# Patient Record
Sex: Female | Born: 1995 | Hispanic: Yes | Marital: Single | Smoking: Never smoker
Health system: Southern US, Community
[De-identification: ages and names within clinical notes are randomized; demographics above are authoritative.]

## PROBLEM LIST (undated history)

## (undated) DIAGNOSIS — D649 Anemia, unspecified: Secondary | ICD-10-CM

## (undated) DIAGNOSIS — E669 Obesity, unspecified: Secondary | ICD-10-CM

## (undated) HISTORY — PX: NO PAST SURGERIES: SHX2092

## (undated) HISTORY — PX: OTHER SURGICAL HISTORY: SHX169

---

## 2017-06-20 NOTE — L&D Delivery Note (Signed)
Delivery Note At 12:03 AM a viable female was delivered via Vaginal, Spontaneous (Presentation:ROA  ).  APGAR:8, 9; weight pending  .   Placenta status: spontaneous, intact.  Cord: 3VC, loose nuchal reduced on perineum without complications.  Cord pH: N/A  Anesthesia: Epidural Episiotomy: None Lacerations: None Suture Repair: none Est. Blood Loss (mL):   Mom to postpartum.  Baby to Couplet care / Skin to Skin.  Vena Austria 02/23/2018, 12:19 AM

## 2017-07-04 ENCOUNTER — Encounter: Payer: Self-pay | Admitting: Maternal Newborn

## 2017-07-04 ENCOUNTER — Ambulatory Visit (INDEPENDENT_AMBULATORY_CARE_PROVIDER_SITE_OTHER): Payer: Medicaid Other | Admitting: Maternal Newborn

## 2017-07-04 VITALS — BP 110/70 | Ht 61.0 in | Wt 179.0 lb

## 2017-07-04 DIAGNOSIS — Z0189 Encounter for other specified special examinations: Secondary | ICD-10-CM

## 2017-07-04 DIAGNOSIS — Z3A01 Less than 8 weeks gestation of pregnancy: Secondary | ICD-10-CM | POA: Diagnosis not present

## 2017-07-04 DIAGNOSIS — Z3481 Encounter for supervision of other normal pregnancy, first trimester: Secondary | ICD-10-CM | POA: Diagnosis not present

## 2017-07-04 DIAGNOSIS — Z6833 Body mass index (BMI) 33.0-33.9, adult: Secondary | ICD-10-CM | POA: Insufficient documentation

## 2017-07-04 DIAGNOSIS — Z3490 Encounter for supervision of normal pregnancy, unspecified, unspecified trimester: Secondary | ICD-10-CM | POA: Insufficient documentation

## 2017-07-04 NOTE — Progress Notes (Signed)
07/04/2017   Chief Complaint: Amenorrhea, positive home pregnancy test, desires prenatal care.  Transfer of Care Patient: no  History of Present Illness: Judith Beard is a 22 y.o. G2P1001 at 5w 4d based on Patient's last menstrual period on12/12/2016 (exact date), with an Estimated Date of Delivery: 03/02/2018, with the above CC.   Her periods were: regular periods every 28 days, lasting 4-5 days. She was using no method when she conceived.  She has Positive signs or symptoms of nausea of pregnancy. Currently well controlled and does not desire medication. She has Negative signs or symptoms of miscarriage or preterm labor She identifies Negative Zika risk factors for her and her partner On any different medications around the time she conceived/early pregnancy: No  History of varicella: Yes   Pregnancy confirmed at [facility] on 06/29/2017.  ROS: A 12-point review of systems was performed and negative, except as stated in the above HPI.  OBGYN History: As per HPI. OB History  Gravida Para Term Preterm AB Living  2 1 1     1   SAB TAB Ectopic Multiple Live Births          1    # Outcome Date GA Lbr Len/2nd Weight Sex Delivery Anes PTL Lv  2 Current           1 Term 04/15/14 4745w0d  7 lb 10 oz (3.459 kg) M Vag-Spont   LIV     Birth Comments: IOL      Any issues with any prior pregnancies: no, G1 elective induction at term  Any prior children are healthy, doing well, without any problems or issues: yes History of pap smears: None, obtained today History of STIs: No   Past Medical History: History reviewed. No pertinent past medical history.  Past Surgical History: History reviewed. No pertinent surgical history.  Family History:  Family History  Problem Relation Age of Onset  . Hypertension Maternal Grandfather    She denies any female cancers, bleeding or blood clotting disorders.  She denies any history of intellectual disability, birth defects or genetic disorders in her  or the FOB's history  Social History:  Social History   Socioeconomic History  . Marital status: Single    Spouse name: Not on file  . Number of children: 1  . Years of education: 2812  . Highest education level: Not on file  Social Needs  . Financial resource strain: Not on file  . Food insecurity - worry: Not on file  . Food insecurity - inability: Not on file  . Transportation needs - medical: Not on file  . Transportation needs - non-medical: Not on file  Occupational History  . Occupation: Conservation officer, naturecashier    Comment[place] at a [employer]  Tobacco Use  . Smoking status: Never Smoker  . Smokeless tobacco: Never Used  Substance and Sexual Activity  . Alcohol use: No    Frequency: Never  . Drug use: No  . Sexual activity: Yes    Birth control/protection: None  Other Topics Concern  . Not on file  Social History Narrative  . Not on file   Any cats in the household: no Denies history of and current domestic violence.  Allergy: Allergies  Allergen Reactions  . Penicillins Shortness Of Breath and Swelling  . Latex Swelling    Current Outpatient Medications: No current outpatient medications on file. Taking OTC prenatal vitamins.  Physical Exam:   BP 110/70   Ht 5\' 1"  (1.549 m)   Wt 179  lb (81.2 kg)   LMP 05/26/2017 (Exact Date)   BMI 33.82 kg/m  Body mass index is 33.82 kg/m. Constitutional: Well nourished, well developed female in no acute distress.  Neck:  Supple, normal appearance, and no thyromegaly  Cardiovascular: S1, S2 normal, no murmur, rub or gallop, regular rate and rhythm Respiratory:  Clear to auscultation bilaterally. Normal respiratory effort Abdomen: no masses, hernias; diffusely non tender to palpation, non distended Breasts: breasts appear normal, no suspicious masses, no skin or nipple changes or axillary nodes. Neuro/Psych:  Normal mood and affect.  Skin:  Warm and dry.  Lymphatic:  No inguinal lymphadenopathy.   Pelvic exam: is not limited by  body habitus External genitalia, Bartholin's glands, Urethra, Skene's glands: within normal limits Vagina: within normal limits and with no blood in the vault  Cervix: normal appearing cervix without discharge or lesions, closed/long/high Uterus:  enlarged, c/w early pregnancy Adnexa:  no mass, fullness, tenderness  Assessment: Judith Beard is a 22 y.o. G2P1001 at 5w 4d based on Patient's last menstrual period on 05/26/2017 (exact date), with an Estimated Date of Delivery: 03/02/2018, presenting for prenatal care.  Plan:  1) Avoid alcoholic beverages. 2) Patient encouraged not to smoke.  3) Discontinue the use of all non-medicinal drugs and chemicals.  4) Take prenatal vitamins daily. Samples given. 5) Seatbelt use advised. 6) Nutrition, food safety (fish, cheese advisories, and high nitrite foods) and exercise discussed. 7) Hospital and practice style delivering at [facility] discussed. 8) Patient is asked about travel to areas at risk for the Zika virus, and counseled to avoid travel and exposure to mosquitoes or sexual partners who may have themselves been exposed to the virus. Testing is discussed, and will be ordered as appropriate.  9) Childbirth classes at [facility] advised. 10) Genetic Screening, such as with 1st Trimester Screening, cell free fetal DNA, AFP testing, and Ultrasound, as well as with amniocentesis and CVS as appropriate, is discussed with patient. She plans to have genetic testing this pregnancy. 11) Early GTT indicated for BMI of 33. Obtain next visit with NOB labs. 12) Ultrasound ordered for next visit for dating/viability.  Problem list reviewed and updated.  Return in about 1 week (around 07/11/2017) for ROB with GTT/labs and ultrasound.  Marcelyn Bruins, CNM [facility], [facility] 07/04/2017  11:46 AM

## 2017-07-04 NOTE — Patient Instructions (Signed)
First Trimester of Pregnancy The first trimester of pregnancy is from week 1 until the end of week 13 (months 1 through 3). A week after a sperm fertilizes an egg, the egg will implant on the wall of the uterus. This embryo will begin to develop into a baby. Genes from you and your partner will form the baby. The female genes will determine whether the baby will be a boy or a girl. At 6-8 weeks, the eyes and face will be formed, and the heartbeat can be seen on ultrasound. At the end of 12 weeks, all the baby's organs will be formed. Now that you are pregnant, you will want to do everything you can to have a healthy baby. Two of the most important things are to get good prenatal care and to follow your health care provider's instructions. Prenatal care is all the medical care you receive before the baby's birth. This care will help prevent, find, and treat any problems during the pregnancy and childbirth. Body changes during your first trimester Your body goes through many changes during pregnancy. The changes vary from woman to woman.  You may gain or lose a couple of pounds at first.  You may feel sick to your stomach (nauseous) and you may throw up (vomit). If the vomiting is uncontrollable, call your health care provider.  You may tire easily.  You may develop headaches that can be relieved by medicines. All medicines should be approved by your health care provider.  You may urinate more often. Painful urination may mean you have a bladder infection.  You may develop heartburn as a result of your pregnancy.  You may develop constipation because certain hormones are causing the muscles that push stool through your intestines to slow down.  You may develop hemorrhoids or swollen veins (varicose veins).  Your breasts may begin to grow larger and become tender. Your nipples may stick out more, and the tissue that surrounds them (areola) may become darker.  Your gums may bleed and may be  sensitive to brushing and flossing.  Dark spots or blotches (chloasma, mask of pregnancy) may develop on your face. This will likely fade after the baby is born.  Your menstrual periods will stop.  You may have a loss of appetite.  You may develop cravings for certain kinds of food.  You may have changes in your emotions from day to day, such as being excited to be pregnant or being concerned that something may go wrong with the pregnancy and baby.  You may have more vivid and strange dreams.  You may have changes in your hair. These can include thickening of your hair, rapid growth, and changes in texture. Some women also have hair loss during or after pregnancy, or hair that feels dry or thin. Your hair will most likely return to normal after your baby is born.  What to expect at prenatal visits During a routine prenatal visit:  You will be weighed to make sure you and the baby are growing normally.  Your blood pressure will be taken.  Your abdomen will be measured to track your baby's growth.  The fetal heartbeat will be listened to between weeks 10 and 14 of your pregnancy.  Test results from any previous visits will be discussed.  Your health care provider may ask you:  How you are feeling.  If you are feeling the baby move.  If you have had any abnormal symptoms, such as leaking fluid, bleeding, severe headaches,   or abdominal cramping.  If you are using any tobacco products, including cigarettes, chewing tobacco, and electronic cigarettes.  If you have any questions.  Other tests that may be performed during your first trimester include:  Blood tests to find your blood type and to check for the presence of any previous infections. The tests will also be used to check for low iron levels (anemia) and protein on red blood cells (Rh antibodies). Depending on your risk factors, or if you previously had diabetes during pregnancy, you may have tests to check for high blood  sugar that affects pregnant women (gestational diabetes).  Urine tests to check for infections, diabetes, or protein in the urine.  An ultrasound to confirm the proper growth and development of the baby.  Fetal screens for spinal cord problems (spina bifida) and Down syndrome.  HIV (human immunodeficiency virus) testing. Routine prenatal testing includes screening for HIV, unless you choose not to have this test.  You may need other tests to make sure you and the baby are doing well.  Follow these instructions at home: Medicines  Follow your health care provider's instructions regarding medicine use. Specific medicines may be either safe or unsafe to take during pregnancy.  Take a prenatal vitamin that contains at least 600 micrograms (mcg) of folic acid.  If you develop constipation, try taking a stool softener if your health care provider approves. Eating and drinking  Eat a balanced diet that includes fresh fruits and vegetables, whole grains, good sources of protein such as meat, eggs, or tofu, and low-fat dairy. Your health care provider will help you determine the amount of weight gain that is right for you.  Avoid raw meat and uncooked cheese. These carry germs that can cause birth defects in the baby.  Eating four or five small meals rather than three large meals a day may help relieve nausea and vomiting. If you start to feel nauseous, eating a few soda crackers can be helpful. Drinking liquids between meals, instead of during meals, also seems to help ease nausea and vomiting.  Limit foods that are high in fat and processed sugars, such as fried and sweet foods.  To prevent constipation: ? Eat foods that are high in fiber, such as fresh fruits and vegetables, whole grains, and beans. ? Drink enough fluid to keep your urine clear or pale yellow. Activity  Exercise only as directed by your health care provider. Most women can continue their usual exercise routine during  pregnancy. Try to exercise for 30 minutes at least 5 days a week. Exercising will help you: ? Control your weight. ? Stay in shape. ? Be prepared for labor and delivery.  Experiencing pain or cramping in the lower abdomen or lower back is a good sign that you should stop exercising. Check with your health care provider before continuing with normal exercises.  Try to avoid standing for long periods of time. Move your legs often if you must stand in one place for a long time.  Avoid heavy lifting.  Wear low-heeled shoes and practice good posture.  You may continue to have sex unless your health care provider tells you not to. Relieving pain and discomfort  Wear a good support bra to relieve breast tenderness.  Take warm sitz baths to soothe any pain or discomfort caused by hemorrhoids. Use hemorrhoid cream if your health care provider approves.  Rest with your legs elevated if you have leg cramps or low back pain.  If you develop   varicose veins in your legs, wear support hose. Elevate your feet for 15 minutes, 3-4 times a day. Limit salt in your diet. Prenatal care  Schedule your prenatal visits by the twelfth week of pregnancy. They are usually scheduled monthly at first, then more often in the last 2 months before delivery.  Write down your questions. Take them to your prenatal visits.  Keep all your prenatal visits as told by your health care provider. This is important. Safety  Wear your seat belt at all times when driving.  Make a list of emergency phone numbers, including numbers for family, friends, the hospital, and police and fire departments. General instructions  Ask your health care provider for a referral to a local prenatal education class. Begin classes no later than the beginning of month 6 of your pregnancy.  Ask for help if you have counseling or nutritional needs during pregnancy. Your health care provider can offer advice or refer you to specialists for help  with various needs.  Do not use hot tubs, steam rooms, or saunas.  Do not douche or use tampons or scented sanitary pads.  Do not cross your legs for long periods of time.  Avoid cat litter boxes and soil used by cats. These carry germs that can cause birth defects in the baby and possibly loss of the fetus by miscarriage or stillbirth.  Avoid all smoking, herbs, alcohol, and medicines not prescribed by your health care provider. Chemicals in these products affect the formation and growth of the baby.  Do not use any products that contain nicotine or tobacco, such as cigarettes and e-cigarettes. If you need help quitting, ask your health care provider. You may receive counseling support and other resources to help you quit.  Schedule a dentist appointment. At home, brush your teeth with a soft toothbrush and be gentle when you floss. Contact a health care provider if:  You have dizziness.  You have mild pelvic cramps, pelvic pressure, or nagging pain in the abdominal area.  You have persistent nausea, vomiting, or diarrhea.  You have a bad smelling vaginal discharge.  You have pain when you urinate.  You notice increased swelling in your face, hands, legs, or ankles.  You are exposed to fifth disease or chickenpox.  You are exposed to German measles (rubella) and have never had it. Get help right away if:  You have a fever.  You are leaking fluid from your vagina.  You have spotting or bleeding from your vagina.  You have severe abdominal cramping or pain.  You have rapid weight gain or loss.  You vomit blood or material that looks like coffee grounds.  You develop a severe headache.  You have shortness of breath.  You have any kind of trauma, such as from a fall or a car accident. Summary  The first trimester of pregnancy is from week 1 until the end of week 13 (months 1 through 3).  Your body goes through many changes during pregnancy. The changes vary from  woman to woman.  You will have routine prenatal visits. During those visits, your health care provider will examine you, discuss any test results you may have, and talk with you about how you are feeling. This information is not intended to replace advice given to you by your health care provider. Make sure you discuss any questions you have with your health care provider. Document Released: 05/31/2001 Document Revised: 05/18/2016 Document Reviewed: 05/18/2016 Elsevier Interactive Patient Education  2018 Elsevier   Inc.  

## 2017-07-04 NOTE — Progress Notes (Signed)
No [email or web address].rj 

## 2017-07-06 LAB — URINE DRUG PANEL 7
Amphetamines, Urine: NEGATIVE ng/mL
BENZODIAZEPINE QUANT UR: NEGATIVE ng/mL
Barbiturate Quant, Ur: NEGATIVE ng/mL
COCAINE (METAB.): NEGATIVE ng/mL
Cannabinoid Quant, Ur: NEGATIVE ng/mL
OPIATE QUANT UR: NEGATIVE ng/mL
PCP QUANT UR: NEGATIVE ng/mL

## 2017-07-06 LAB — PAP LB, CT-NG, RFX HPV ASCU
CHLAMYDIA, NUC. ACID AMP: NEGATIVE
Gonococcus, Nuc. Acid Amp: NEGATIVE
PAP Smear Comment: 0

## 2017-07-06 LAB — URINE CULTURE

## 2017-07-12 ENCOUNTER — Other Ambulatory Visit: Payer: Self-pay

## 2017-07-12 ENCOUNTER — Emergency Department
Admission: EM | Admit: 2017-07-12 | Discharge: 2017-07-12 | Disposition: A | Discharge status: Home / Self Care | Payer: Medicaid Other | Attending: Emergency Medicine | Admitting: Emergency Medicine

## 2017-07-12 DIAGNOSIS — Z3A00 Weeks of gestation of pregnancy not specified: Secondary | ICD-10-CM | POA: Diagnosis not present

## 2017-07-12 DIAGNOSIS — O200 Threatened abortion: Secondary | ICD-10-CM | POA: Insufficient documentation

## 2017-07-12 DIAGNOSIS — Z91040 Latex allergy status: Secondary | ICD-10-CM | POA: Diagnosis not present

## 2017-07-12 DIAGNOSIS — O209 Hemorrhage in early pregnancy, unspecified: Secondary | ICD-10-CM | POA: Diagnosis present

## 2017-07-12 LAB — CBC WITH DIFFERENTIAL/PLATELET
BASOS ABS: 0 10*3/uL (ref 0–0.1)
BASOS PCT: 0 %
EOS PCT: 2 %
Eosinophils Absolute: 0.2 10*3/uL (ref 0–0.7)
HCT: 37.9 % (ref 35.0–47.0)
Hemoglobin: 13.4 g/dL (ref 12.0–16.0)
LYMPHS PCT: 30 %
Lymphs Abs: 3.2 10*3/uL (ref 1.0–3.6)
MCH: 34.2 pg — ABNORMAL HIGH (ref 26.0–34.0)
MCHC: 35.4 g/dL (ref 32.0–36.0)
MCV: 96.7 fL (ref 80.0–100.0)
Monocytes Absolute: 0.9 10*3/uL (ref 0.2–0.9)
Monocytes Relative: 9 %
Neutro Abs: 6.3 10*3/uL (ref 1.4–6.5)
Neutrophils Relative %: 59 %
PLATELETS: 332 10*3/uL (ref 150–440)
RBC: 3.91 MIL/uL (ref 3.80–5.20)
RDW: 13.2 % (ref 11.5–14.5)
WBC: 10.7 10*3/uL (ref 3.6–11.0)

## 2017-07-12 LAB — COMPREHENSIVE METABOLIC PANEL
ALBUMIN: 4 g/dL (ref 3.5–5.0)
ALT: 17 U/L (ref 14–54)
AST: 20 U/L (ref 15–41)
Alkaline Phosphatase: 51 U/L (ref 38–126)
Anion gap: 8 (ref 5–15)
BUN: 9 mg/dL (ref 6–20)
CHLORIDE: 104 mmol/L (ref 101–111)
CO2: 23 mmol/L (ref 22–32)
CREATININE: 0.48 mg/dL (ref 0.44–1.00)
Calcium: 9 mg/dL (ref 8.9–10.3)
GFR calc Af Amer: >60 mL/min (ref >60)
GFR calc non Af Amer: >60 mL/min (ref >60)
Glucose, Bld: 96 mg/dL (ref 65–99)
POTASSIUM: 3.4 mmol/L — AB (ref 3.5–5.1)
Sodium: 135 mmol/L (ref 135–145)
Total Bilirubin: 0.4 mg/dL (ref 0.3–1.2)
Total Protein: 7.4 g/dL (ref 6.5–8.1)

## 2017-07-12 LAB — TYPE AND SCREEN
ABO/RH(D): A POS
Antibody Screen: NEGATIVE

## 2017-07-12 LAB — POCT PREGNANCY, URINE: PREG TEST UR: POSITIVE — AB

## 2017-07-12 NOTE — ED Triage Notes (Signed)
Pt reports that she is 2 months pregnant and having vaginal bleeding (blood is on toilet paper when she wipes) - pt is not having to wear pad - denies abd pain or back pain

## 2017-07-12 NOTE — ED Provider Notes (Signed)
[facility] Emergency Department Provider Note   ____________________________________________   I have reviewed the triage vital signs and the nursing notes.   HISTORY  Chief Complaint Vaginal Bleeding   History limited by: Not Limited   HPI Judith Beard is a 22 y.o. female who presents to the emergency department today because of concerns for vaginal spotting in the setting of early pregnancy.  The patient states she is roughly 2 months pregnant.  She states today when she wiped she noticed some blood.  She denies any abdominal pain.  She denies any other vaginal discharge.  She does have an appointment tomorrow with her OB/GYN doctor.  She states they did recommend she present to the emergency department today when she called them.  She states that she had a little bit of nausea early in her pregnancy however that has improved.   Per medical record review patient has a history of pregnancy  History reviewed. No pertinent past medical history.  Patient Active Problem List   Diagnosis Date Noted  . Supervision of normal pregnancy 07/04/2017  . BMI 33.0-33.9,adult 07/04/2017    History reviewed. No pertinent surgical history.  Prior to Admission medications   Not on File    Allergies Penicillins and Latex  Family History  Problem Relation Age of Onset  . Hypertension Maternal Grandfather     Social History Social History   Tobacco Use  . Smoking status: Never Smoker  . Smokeless tobacco: Never Used  Substance Use Topics  . Alcohol use: No    Frequency: Never  . Drug use: No    Review of Systems Constitutional: No fever/chills Eyes: No visual changes. ENT: No sore throat. Cardiovascular: Denies chest pain. Respiratory: Denies shortness of breath. Gastrointestinal: No abdominal pain.  No nausea, no vomiting.  No diarrhea.   Genitourinary: positive for vaginal spotting Musculoskeletal: Negative for back pain. Skin: Negative  for rash. Neurological: Negative for headaches, focal weakness or numbness.  ____________________________________________   PHYSICAL EXAM:  VITAL SIGNS: ED Triage Vitals [07/12/17 1702]  Enc Vitals Group     BP (!) 145/93     Pulse Rate (!) 104     Resp 16     Temp 97.9 F (36.6 C)     Temp Source Oral     SpO2 100 %     Weight 170 lb (77.1 kg)     Height 5\' 1"  (1.549 m)     Head Circumference      Peak Flow      Pain Score 0   Constitutional: Alert and oriented. Well appearing and in no distress. Eyes: Conjunctivae are normal.  ENT   Head: Normocephalic and atraumatic.   Nose: No congestion/rhinnorhea.   Mouth/Throat: Mucous membranes are moist.   Neck: No stridor. Hematological/Lymphatic/Immunilogical: No cervical lymphadenopathy. Cardiovascular: Normal rate, regular rhythm.  No murmurs, rubs, or gallops.  Respiratory: Normal respiratory effort without tachypnea nor retractions. Breath sounds are clear and equal bilaterally. No wheezes/rales/rhonchi. Gastrointestinal: Soft and non tender. No rebound. No guarding.  Genitourinary: Deferred Musculoskeletal: Normal range of motion in all extremities. No lower extremity edema. Neurologic:  Normal speech and language. No gross focal neurologic deficits are appreciated.  Skin:  Skin is warm, dry and intact. No rash noted. Psychiatric: Mood and affect are normal. Speech and behavior are normal. Patient exhibits appropriate insight and judgment.  ____________________________________________    LABS (pertinent positives/negatives)  Upreg positive CMP wnl except k 3.4 CBC wnl except mch 34.2 A  POS ____________________________________________   EKG  None  ____________________________________________    RADIOLOGY  Bedside US with IUP with cardiac activity  ____________________________________________   PROCEDURES  Procedures  ____________________________________________   INITIAL IMPRESSION /  ASSESSMENT AND PLAN / ED COURSE  Pertinent labs & imaging results that were available during my care of the patient were reviewed by me and considered in my medical decision making (see chart for details).  Patient presented to the emergency department because of concerns for vaginal spotting in early pregnancy.  Bedside ultrasound does show a intrauterine pregnancy with cardiac activity.  Patient has follow-up already scheduled with OB/GYN tomorrow.  Rh+.  Discussed with patient importance of following up with her OB/GYN appointment tomorrow.  Discussed with patient/family results of testing/physical exam, differential plan and return precautions.  ____________________________________________   FINAL CLINICAL IMPRESSION(S) / ED DIAGNOSES  Final diagnoses:  None     Note: This dictation was prepared with Dragon dictation. Any transcriptional errors that result from this process are unintentional     Phineas Semen, MD 07/12/17 1836

## 2017-07-12 NOTE — Discharge Instructions (Signed)
Please seek medical attention for any high fevers, chest pain, shortness of breath, change in behavior, persistent vomiting, bloody stool or any other new or concerning symptoms.  

## 2017-07-13 ENCOUNTER — Other Ambulatory Visit: Payer: Medicaid Other

## 2017-07-13 ENCOUNTER — Ambulatory Visit: Payer: Medicaid Other

## 2017-07-13 ENCOUNTER — Ambulatory Visit (INDEPENDENT_AMBULATORY_CARE_PROVIDER_SITE_OTHER): Payer: Medicaid Other

## 2017-07-13 ENCOUNTER — Ambulatory Visit (INDEPENDENT_AMBULATORY_CARE_PROVIDER_SITE_OTHER): Payer: Medicaid Other | Admitting: Obstetrics & Gynecology

## 2017-07-13 VITALS — BP 130/80 | Wt 180.0 lb

## 2017-07-13 DIAGNOSIS — O209 Hemorrhage in early pregnancy, unspecified: Secondary | ICD-10-CM

## 2017-07-13 DIAGNOSIS — Z0189 Encounter for other specified special examinations: Secondary | ICD-10-CM

## 2017-07-13 DIAGNOSIS — Z3A01 Less than 8 weeks gestation of pregnancy: Secondary | ICD-10-CM

## 2017-07-13 DIAGNOSIS — Z6833 Body mass index (BMI) 33.0-33.9, adult: Secondary | ICD-10-CM

## 2017-07-13 DIAGNOSIS — Z3481 Encounter for supervision of other normal pregnancy, first trimester: Secondary | ICD-10-CM

## 2017-07-13 NOTE — Progress Notes (Signed)
Pt seen for US, had one episode of vag bleeding yesterday, resolved See US report Labs today Glucola nv.  First screen nv.  Annamarie MajorPaul Chantrice Hagg, MD, Merlinda FrederickFACOG [facility], [facility] 07/13/2017  11:19 AM

## 2017-07-13 NOTE — Progress Notes (Signed)
ULTRASOUND REPORT  Location: [facility] Date of Service: 07/13/2017   Indications:Dating Findings:  Mason JimSingleton intrauterine pregnancy is visualized with a CRL consistent with 3545w5d gestation, giving an (U/S) EDD of 03/03/2018. The (U/S) EDD is consistent with the clinically established (LMP) EDD of 03/02/2018.  FHR: 129 bpm CRL measurement: 83 mm Yolk sac is seen and appear normal and and early anatomy is normal.  Right Ovary is normal in appearance. Left Ovary is normal appearance. Corpus luteal cyst:  Left ovary Survey of the adnexa demonstrates no adnexal masses. There is no free peritoneal fluid in the cul de sac.  Impression: 1. 4045w5d Viable Singleton Intrauterine pregnancy by U/S. 2. (U/S) EDD is consistent with Clinically established (LMP) EDD of 11/30/2017.  Recommendations: 1.Clinical correlation with the patient's History and Physical Exam.  Review of ULTRASOUND.    I have personally reviewed images and [facility]    Plan of management to be discussed with patient.  Annamarie MajorPaul Emmilia Sowder, MD, Merlinda FrederickFACOG [facility], [facility] 07/13/2017  11:07 AM

## 2017-07-14 LAB — RPR+RH+ABO+RUB AB+AB SCR+CB...
Antibody Screen: NEGATIVE
HEMOGLOBIN: 12.8 g/dL (ref 11.1–15.9)
HEP B S AG: NEGATIVE
HIV Screen 4th Generation wRfx: NONREACTIVE
Hematocrit: 37.8 % (ref 34.0–46.6)
MCH: 33.7 pg — ABNORMAL HIGH (ref 26.6–33.0)
MCHC: 33.9 g/dL (ref 31.5–35.7)
MCV: 100 fL — ABNORMAL HIGH (ref 79–97)
Platelets: 334 10*3/uL (ref 150–379)
RBC: 3.8 x10E6/uL (ref 3.77–5.28)
RDW: 13.3 % (ref 12.3–15.4)
RPR Ser Ql: NONREACTIVE
RUBELLA: 1.51 {index} (ref >0.99)
Rh Factor: POSITIVE
WBC: 7.8 10*3/uL (ref 3.4–10.8)

## 2017-08-09 ENCOUNTER — Other Ambulatory Visit: Payer: Medicaid Other

## 2017-08-09 ENCOUNTER — Other Ambulatory Visit: Payer: Self-pay | Admitting: Maternal Newborn

## 2017-08-09 ENCOUNTER — Ambulatory Visit: Payer: Medicaid Other

## 2017-08-09 ENCOUNTER — Ambulatory Visit (INDEPENDENT_AMBULATORY_CARE_PROVIDER_SITE_OTHER): Payer: Medicaid Other | Admitting: Maternal Newborn

## 2017-08-09 ENCOUNTER — Ambulatory Visit (INDEPENDENT_AMBULATORY_CARE_PROVIDER_SITE_OTHER): Payer: Medicaid Other

## 2017-08-09 ENCOUNTER — Encounter: Payer: Medicaid Other | Admitting: Maternal Newborn

## 2017-08-09 VITALS — BP 128/70 | Wt 182.0 lb

## 2017-08-09 DIAGNOSIS — Z3A01 Less than 8 weeks gestation of pregnancy: Secondary | ICD-10-CM

## 2017-08-09 DIAGNOSIS — Z1379 Encounter for other screening for genetic and chromosomal anomalies: Secondary | ICD-10-CM

## 2017-08-09 DIAGNOSIS — Z3481 Encounter for supervision of other normal pregnancy, first trimester: Secondary | ICD-10-CM

## 2017-08-09 DIAGNOSIS — Z3A10 10 weeks gestation of pregnancy: Secondary | ICD-10-CM

## 2017-08-09 NOTE — Progress Notes (Signed)
    Routine Prenatal Care Visit  Subjective  Judith PrestoJennifer Beard is a 22 y.o. G2P1001 at 8736w5d being seen today for ongoing prenatal care.  She is currently monitored for the following issues for this low-risk pregnancy and has Supervision of normal pregnancy and BMI 33.0-33.9,adult on their problem list.  ----------------------------------------------------------------------------------- Patient reports numbness in her right buttock when standing at work for long periods. She also has some hip pain on the same side. Vag. Bleeding: None. Denies leaking of fluid.  ----------------------------------------------------------------------------------- The following portions of the patient's history were reviewed and updated as appropriate: allergies, current medications, past family history, past medical history, past social history, past surgical history and problem list. Problem list updated.   Objective  Blood pressure 128/70, weight 182 lb (82.6 kg), last menstrual period 05/26/2017. Pregravid weight Pregravid weight not on file Total Weight Gain Not found. Urinalysis:      Fetal Status: Fetal Heart Rate (bpm): 163         General:  Alert, oriented and cooperative. Patient is in no acute distress.  Skin: Skin is warm and dry. No rash noted.   Cardiovascular: Normal heart rate noted  Respiratory: Normal respiratory effort, no problems with respiration noted  Abdomen: Soft, gravid, appropriate for gestational age. Pain/Pressure: Absent     Pelvic:  Cervical exam deferred        Extremities: Normal range of motion.     Mental Status: Normal mood and affect. Normal behavior. Normal judgment and thought content.     Assessment   21 y.o. G2P1001 at 3436w5d, EDD 03/02/2018 by Last Menstrual Period presenting for routine prenatal visit.  Plan   SECOND Problems (from 07/04/17 to present)    Problem Noted Resolved   Supervision of normal pregnancy 07/04/2017 by Oswaldo ConroySchmid, Edwing Figley Y, CNM No   Overview Signed 07/07/2017  8:17 AM by Oswaldo ConroySchmid, Spenser Harren Y, CNM    Clinic [facility] Prenatal Labs  Dating  Blood type:     Genetic Screen 1 Screen:    AFP:     Quad:     NIPS: Antibody:   Anatomic US  Rubella:   Varicella:    GTT Early:               Third trimester:  RPR:     Rhogam  HBsAg:     TDaP vaccine                       Flu Shot: HIV:     Baby Food                                GBS:   Contraception  Pap: 07/04/17, NILM  CBB     CS/VBAC    Support Person               Not able to complete NT scan today due to fetal size, return in week 12 to retry. Advised alternating standing and sitting at work to relieve symptoms of hip pain and numbness in buttock; patient reports that intermittent seated breaks have already helped lessen frequency of symptoms.  Preterm labor symptoms and general obstetric precautions including but not limited to vaginal bleeding, contractions, leaking of fluid and fetal movement were reviewed in detail with the patient..   Return in about 2 weeks (around 08/23/2017) for ROB with NT/First Trimester screen.  Marcelyn BruinsJacelyn Aziah Kaiser, CNM 08/09/2017  12:18 PM

## 2017-08-10 ENCOUNTER — Other Ambulatory Visit: Payer: Self-pay | Admitting: Maternal Newborn

## 2017-08-10 DIAGNOSIS — Z3481 Encounter for supervision of other normal pregnancy, first trimester: Secondary | ICD-10-CM

## 2017-08-10 LAB — GLUCOSE, 1 HOUR GESTATIONAL: Gestational Diabetes Screen: 126 mg/dL (ref 65–139)

## 2017-08-15 ENCOUNTER — Other Ambulatory Visit: Payer: Medicaid Other

## 2017-08-15 ENCOUNTER — Encounter: Payer: Medicaid Other | Admitting: Obstetrics & Gynecology

## 2017-08-24 ENCOUNTER — Ambulatory Visit (INDEPENDENT_AMBULATORY_CARE_PROVIDER_SITE_OTHER): Payer: Medicaid Other | Admitting: Advanced Practice Midwife

## 2017-08-24 ENCOUNTER — Encounter: Payer: Self-pay | Admitting: Advanced Practice Midwife

## 2017-08-24 ENCOUNTER — Ambulatory Visit (INDEPENDENT_AMBULATORY_CARE_PROVIDER_SITE_OTHER): Payer: Medicaid Other

## 2017-08-24 VITALS — BP 112/70 | Wt 182.0 lb

## 2017-08-24 DIAGNOSIS — Z1379 Encounter for other screening for genetic and chromosomal anomalies: Secondary | ICD-10-CM | POA: Diagnosis not present

## 2017-08-24 DIAGNOSIS — Z3A12 12 weeks gestation of pregnancy: Secondary | ICD-10-CM

## 2017-08-24 NOTE — Progress Notes (Signed)
  Routine Prenatal Care Visit  Subjective  Judith Beard is a 22 y.o. G2P1001 at 3434w6d being seen today for ongoing prenatal care.  She is currently monitored for the following issues for this high-risk pregnancy and has Supervision of normal pregnancy and BMI 33.0-33.9,adult on their problem list.  ----------------------------------------------------------------------------------- Patient reports some lower abdominal cramps especially at night.    . Vag. Bleeding: None.   . Denies leaking of fluid.  ----------------------------------------------------------------------------------- The following portions of the patient's history were reviewed and updated as appropriate: allergies, current medications, past family history, past medical history, past social history, past surgical history and problem list. Problem list updated.   Objective  Blood pressure 112/70, weight 182 lb (82.6 kg), last menstrual period 05/26/2017. Pregravid weight Pregravid weight not on file Total Weight Gain Not found. Urinalysis: Urine Protein: Negative Urine Glucose: Negative  Fetal Status: Fetal Heart Rate (bpm): 166         First trimester screen today: NT scan: CRL 71.1, NT 2.2, nasal bone present  General:  Alert, oriented and cooperative. Patient is in no acute distress.  Skin: Skin is warm and dry. No rash noted.   Cardiovascular: Normal heart rate noted  Respiratory: Normal respiratory effort, no problems with respiration noted  Abdomen: Soft, gravid, appropriate for gestational age. Pain/Pressure: Absent     Pelvic:  Cervical exam deferred        Extremities: Normal range of motion.     Mental Status: Normal mood and affect. Normal behavior. Normal judgment and thought content.   Assessment   22 y.o. G2P1001 at 2534w6d by  03/02/2018, by Last Menstrual Period presenting for routine prenatal visit  Plan   SECOND Problems (from 07/04/17 to present)    Problem Noted Resolved   Supervision of  normal pregnancy 07/04/2017 by Oswaldo ConroySchmid, Jacelyn Y, CNM No   Overview Addendum 08/10/2017  4:35 PM by Oswaldo ConroySchmid, Jacelyn Y, CNM    Clinic [facility] Prenatal Labs  Dating L=6 Blood type: A/Positive/-- (01/24 1119)   Genetic Screen 1 Screen:    AFP:     Quad:     NIPS: Antibody:Negative (01/24 1119)  Anatomic US  Rubella: 1.51 (01/24 1119) Varicella: Non-Immune  GTT Early: 126               Third trimester:  RPR: Non Reactive (01/24 1119)   Rhogam N/A HBsAg: Negative (01/24 1119)   TDaP vaccine                       Flu Shot: HIV: Non Reactive (01/24 1119)   Baby Food                                GBS:   Contraception  Pap: 07/04/17, NILM  CBB     CS/VBAC    Support Person                  Preterm labor symptoms and general obstetric precautions including but not limited to vaginal bleeding, contractions, leaking of fluid and fetal movement were reviewed in detail with the patient. Please refer to After Visit Summary for other counseling recommendations.  Recommend adequate hydration and protein intake.  Return in about 4 weeks (around 09/21/2017) for rob.  Tresea MallJane Corynne Scibilia, CNM 08/24/2017 2:58 PM

## 2017-08-24 NOTE — Patient Instructions (Signed)

## 2017-08-24 NOTE — Progress Notes (Signed)
C/o stomach cramps at night - nl?rj

## 2017-08-26 LAB — FIRST TRIMESTER SCREEN W/NT
CRL: 71.1 mm
DIA MOM: 0.8
DIA VALUE: 156.2 pg/mL
GEST AGE-COLLECT: 13.1 wk
HCG VALUE: 43.1 [IU]/mL
Maternal Age At EDD: 23.5 yr
NUMBER OF FETUSES: 1
Nuchal Translucency MoM: 1.5
Nuchal Translucency: 2.2 mm
PAPP-A MoM: 1.39
PAPP-A Value: 1372.9 ng/mL
TEST RESULTS: NEGATIVE
Weight: 182 [lb_av]
hCG MoM: 0.61

## 2017-09-21 ENCOUNTER — Ambulatory Visit (INDEPENDENT_AMBULATORY_CARE_PROVIDER_SITE_OTHER): Payer: Medicaid Other | Admitting: Certified Nurse Midwife

## 2017-09-21 ENCOUNTER — Ambulatory Visit: Payer: Medicaid Other

## 2017-09-21 VITALS — BP 112/54 | Wt 180.0 lb

## 2017-09-21 DIAGNOSIS — Z363 Encounter for antenatal screening for malformations: Secondary | ICD-10-CM

## 2017-09-21 DIAGNOSIS — Z1379 Encounter for other screening for genetic and chromosomal anomalies: Secondary | ICD-10-CM

## 2017-09-21 DIAGNOSIS — Z3A16 16 weeks gestation of pregnancy: Secondary | ICD-10-CM

## 2017-09-21 DIAGNOSIS — Z3480 Encounter for supervision of other normal pregnancy, unspecified trimester: Secondary | ICD-10-CM

## 2017-09-21 NOTE — Progress Notes (Signed)
ROB

## 2017-09-21 NOTE — Progress Notes (Signed)
ROB at 16wk6d: Doing well. +flutters. THought she was having ultrasound today First trimester test neg, 1hr gTT 126 Desires [facility] today ROB and anatomy scan in 2-3 weeks.  Judith Beard, CNM

## 2017-09-26 LAB — AFP, SERUM, OPEN SPINA BIFIDA
AFP MoM: 1.08
AFP Value: 34.8 ng/mL
GEST. AGE ON COLLECTION DATE: 16.9 wk
MATERNAL AGE AT EDD: 23.4 a
OSBR Risk 1 IN: 9540
Test Results:: NEGATIVE
WEIGHT: 180 [lb_av]

## 2017-10-09 ENCOUNTER — Encounter: Payer: Medicaid Other | Admitting: Obstetrics and Gynecology

## 2017-10-09 ENCOUNTER — Encounter: Payer: Medicaid Other | Admitting: Certified Nurse Midwife

## 2017-10-09 ENCOUNTER — Other Ambulatory Visit: Payer: Medicaid Other

## 2017-10-11 ENCOUNTER — Encounter: Payer: Self-pay | Admitting: Obstetrics and Gynecology

## 2017-10-11 ENCOUNTER — Ambulatory Visit (INDEPENDENT_AMBULATORY_CARE_PROVIDER_SITE_OTHER): Payer: Medicaid Other

## 2017-10-11 ENCOUNTER — Ambulatory Visit (INDEPENDENT_AMBULATORY_CARE_PROVIDER_SITE_OTHER): Payer: Medicaid Other | Admitting: Obstetrics and Gynecology

## 2017-10-11 VITALS — BP 118/74 | Wt 181.0 lb

## 2017-10-11 DIAGNOSIS — Z3482 Encounter for supervision of other normal pregnancy, second trimester: Secondary | ICD-10-CM

## 2017-10-11 DIAGNOSIS — Z3A19 19 weeks gestation of pregnancy: Secondary | ICD-10-CM

## 2017-10-11 DIAGNOSIS — Z363 Encounter for antenatal screening for malformations: Secondary | ICD-10-CM | POA: Diagnosis not present

## 2017-10-11 DIAGNOSIS — Z6833 Body mass index (BMI) 33.0-33.9, adult: Secondary | ICD-10-CM

## 2017-10-11 DIAGNOSIS — O99212 Obesity complicating pregnancy, second trimester: Secondary | ICD-10-CM

## 2017-10-11 DIAGNOSIS — O99210 Obesity complicating pregnancy, unspecified trimester: Secondary | ICD-10-CM | POA: Insufficient documentation

## 2017-10-11 NOTE — Progress Notes (Addendum)
Routine Prenatal Care Visit  Subjective  Judith Beard is a 22 y.o. G2P1001 at 7486w5d being seen today for ongoing prenatal care.  She is currently monitored for the following issues for this high-risk pregnancy and has Supervision of normal pregnancy; BMI 33.0-33.9,adult; and Obesity affecting pregnancy on their problem list.  ----------------------------------------------------------------------------------- Patient reports no complaints.    . Vag. Bleeding: None.  Movement: Present. Denies leaking of fluid.  Anatomy u/s incomplete today.  ----------------------------------------------------------------------------------- The following portions of the patient's history were reviewed and updated as appropriate: allergies, current medications, past family history, past medical history, past social history, past surgical history and problem list. Problem list updated.  Objective  Blood pressure 118/74, weight 181 lb (82.1 kg), last menstrual period 05/26/2017. Pregravid weight 179 lb (81.2 kg) Total Weight Gain 2 lb (0.907 kg) Urinalysis: Urine Protein: Negative Urine Glucose: Negative  Fetal Status: Fetal Heart Rate (bpm): present   Movement: Present     General:  Alert, oriented and cooperative. Patient is in no acute distress.  Skin: Skin is warm and dry. No rash noted.   Cardiovascular: Normal heart rate noted  Respiratory: Normal respiratory effort, no problems with respiration noted  Abdomen: Soft, gravid, appropriate for gestational age. Pain/Pressure: Absent     Pelvic:  Cervical exam deferred        Extremities: Normal range of motion.     Mental Status: Normal mood and affect. Normal behavior. Normal judgment and thought content.   [place] Ob Comp + 14 Wk  Result Date: 10/13/2017 ULTRASOUND REPORT Patient Name: Judith Beard DOB: 05/12/1996 MRN: [id number] Location: [facility] Date of Service: 10/11/2017 Indications:Anatomy Ultrasound Findings: Mason JimSingleton  intrauterine pregnancy is visualized with FHR at 153 BPM. Biometrics give an (U/S) Gestational age of 1678w3d and an (U/S) EDD of 03/04/2018; this correlates with the clinically established Estimated Date of Delivery: 03/02/18 Fetal presentation is transverse. EFW: 11 oz. Placenta: Anterior, grade 1. AFI: subjectively normal. Anatomic survey is incomplete for cardiac views, diaphragm, abdominal cord insertion anf profile and normal; Gender - female.  Right Ovary is normal in appearance. Left Ovary is normal appearance. Survey of the adnexa demonstrates no adnexal masses. There is no free peritoneal fluid in the cul de sac. Impression: 1. 6186w5d Viable Singleton Intrauterine pregnancy by U/S. 2. (U/S) EDD is consistent with Clinically established Estimated Date of Delivery: 03/02/18 . 3. Normal Anatomy Scan Recommendations: 1.Clinical correlation with the patient's History and Physical Exam. 2. Follow up in 4 weeks for completion of anatomic survey. Willette AlmaKristen Priestley, RDMS, RVT There is a singleton gestation with subjectively normal amniotic fluid volume. The fetal biometry correlates with established dating. Detailed evaluation of the fetal anatomy was performed.The fetal anatomical survey appears within normal limits within the resolution of ultrasound as described above, except those noted to be incomplete.  It must be noted that a normal ultrasound is unable to rule out fetal aneuploidy nor is it able to detect all possible malformations.   The ultrasound images and findings were reviewed by me and I agree with the above report. Thomasene MohairStephen Isaish Alemu, MD, Merlinda FrederickFACOG [facility], [facility] 10/13/2017 1:41 PM    Assessment   22 y.o. G2P1001 at 6286w5d by  03/02/2018, by Last Menstrual Period presenting for routine prenatal visit  Plan   SECOND Problems (from 07/04/17 to present)    Problem Noted Resolved   Obesity affecting pregnancy 10/11/2017 by Conard NovakJackson, Milania Haubner D, MD No   Supervision of normal pregnancy  07/04/2017 by Oswaldo ConroySchmid, Jacelyn Y,  CNM No   Overview Addendum 10/04/2017  4:20 PM by Farrel Conners, CNM    Clinic [facility] Prenatal Labs  Dating L=6 Blood type: A/Positive/-- (01/24 1119)   Genetic Screen 1 Screen: neg   AFP:  neg   Quad:     NIPS: Antibody:Negative (01/24 1119)  Anatomic [place] Incomplete - follow up next visit Rubella: 1.51 (01/24 1119) Varicella: Non-Immune  GTT Early: 126               Third trimester:  RPR: Non Reactive (01/24 1119)   Rhogam N/A HBsAg: Negative (01/24 1119)   TDaP vaccine                       Flu Shot: HIV: Non Reactive (01/24 1119)   Baby Food                                GBS:   Contraception  Pap: 07/04/17, NILM  CBB     CS/VBAC    Support Person                Preterm labor symptoms and general obstetric precautions including but not limited to vaginal bleeding, contractions, leaking of fluid and fetal movement were reviewed in detail with the patient. Please refer to After Visit Summary for other counseling recommendations.   Return in about 1 month (around 11/08/2017) for schedule u/s for completion anatomy with routine prenatal after.  Thomasene Mohair, MD, [facility], [facility] 10/11/2017 11:25 AM

## 2017-11-08 ENCOUNTER — Ambulatory Visit (INDEPENDENT_AMBULATORY_CARE_PROVIDER_SITE_OTHER): Payer: Medicaid Other

## 2017-11-08 ENCOUNTER — Encounter: Payer: Self-pay | Admitting: Maternal Newborn

## 2017-11-08 ENCOUNTER — Ambulatory Visit (INDEPENDENT_AMBULATORY_CARE_PROVIDER_SITE_OTHER): Payer: Medicaid Other | Admitting: Maternal Newborn

## 2017-11-08 VITALS — BP 112/62 | Wt 186.0 lb

## 2017-11-08 DIAGNOSIS — Z3482 Encounter for supervision of other normal pregnancy, second trimester: Secondary | ICD-10-CM

## 2017-11-08 DIAGNOSIS — Z3A23 23 weeks gestation of pregnancy: Secondary | ICD-10-CM

## 2017-11-08 DIAGNOSIS — O99212 Obesity complicating pregnancy, second trimester: Secondary | ICD-10-CM | POA: Diagnosis not present

## 2017-11-08 NOTE — Progress Notes (Signed)
Routine Prenatal Care Visit  Subjective  Judith Beard is a 22 y.o. G2P1001 at [redacted]w[redacted]d being seen today for ongoing prenatal care.  She is currently monitored for the following issues for this low-risk pregnancy and has Supervision of normal pregnancy; BMI 33.0-33.9,adult; and Obesity affecting pregnancy on their problem list.  ----------------------------------------------------------------------------------- Patient reports no complaints. She is asking whether it is safe to travel to [place] by air. Vag. Bleeding: None.  Movement: Present. No leaking of fluid.  ----------------------------------------------------------------------------------- The following portions of the patient's history were reviewed and updated as appropriate: allergies, current medications, past family history, past medical history, past social history, past surgical history and problem list. Problem list updated.   Objective  Blood pressure 112/62, weight 186 lb (84.4 kg), last menstrual period 05/26/2017. Pregravid weight 179 lb (81.2 kg) Total Weight Gain 7 lb (3.175 kg) Urinalysis: Urine Protein: Trace Urine Glucose: Negative  Fetal Status: Fetal Heart Rate (bpm): 154   Movement: Present     General:  Alert, oriented and cooperative. Patient is in no acute distress.  Skin: Skin is warm and dry. No rash noted.   Cardiovascular: Normal heart rate noted  Respiratory: Normal respiratory effort, no problems with respiration noted  Abdomen: Soft, gravid, appropriate for gestational age. Pain/Pressure: Absent     Pelvic:  Cervical exam deferred        Extremities: Normal range of motion.     Mental Status: Normal mood and affect. Normal behavior. Normal judgment and thought content.     Assessment   22 y.o. G2P1001 at [redacted]w[redacted]d, EDD 03/02/2018 by Last Menstrual Period presenting for routine prenatal visit.  Plan   SECOND Problems (from 07/04/17 to present)    Problem Noted Resolved   Obesity affecting  pregnancy 10/11/2017 by Conard Novak, MD No   Supervision of normal pregnancy 07/04/2017 by Oswaldo Conroy, CNM No   Overview Addendum 10/04/2017  4:20 PM by Farrel Conners, CNM    Clinic [facility] Prenatal Labs  Dating L=6 Blood type: A/Positive/-- (01/24 1119)   Genetic Screen 1 Screen: neg   AFP:  neg   Quad:     NIPS: Antibody:Negative (01/24 1119)  Anatomic [place]  Rubella: 1.51 (01/24 1119) Varicella: Non-Immune  GTT Early: 126               Third trimester:  RPR: Non Reactive (01/24 1119)   Rhogam N/A HBsAg: Negative (01/24 1119)   TDaP vaccine                       Flu Shot: HIV: Non Reactive (01/24 1119)   Baby Food                                GBS:   Contraception  Pap: 07/04/17, NILM  CBB     CS/VBAC    Support Person               Anatomy scan complete with suboptimal RVOT.  Discussed air travel and advised precautions against DVT such as moving around in the cabin when in flight and safe to do so and wearing compression stockings.  Preterm labor symptoms and general obstetric precautions including but not limited to vaginal bleeding, leaking of fluid and fetal movement were reviewed.  Return in about 1 month (around 12/06/2017) for ROB with GTT and 28 week labs.  Marcelyn Bruins, CNM 11/08/2017  10:42 AM

## 2017-11-08 NOTE — Progress Notes (Signed)
Pt reports no problems. F/u anatomy scan today.

## 2017-12-06 ENCOUNTER — Other Ambulatory Visit: Payer: Medicaid Other

## 2017-12-06 ENCOUNTER — Encounter: Payer: Self-pay | Admitting: Advanced Practice Midwife

## 2017-12-06 ENCOUNTER — Ambulatory Visit (INDEPENDENT_AMBULATORY_CARE_PROVIDER_SITE_OTHER): Payer: Medicaid Other | Admitting: Advanced Practice Midwife

## 2017-12-06 VITALS — BP 130/66 | Wt 192.0 lb

## 2017-12-06 DIAGNOSIS — Z3482 Encounter for supervision of other normal pregnancy, second trimester: Secondary | ICD-10-CM

## 2017-12-06 DIAGNOSIS — Z3A27 27 weeks gestation of pregnancy: Secondary | ICD-10-CM

## 2017-12-06 NOTE — Progress Notes (Signed)
No [email or web address].rj 

## 2017-12-06 NOTE — Progress Notes (Signed)
  Routine Prenatal Care Visit  Subjective  Judith Beard is a 22 y.o. G2P1001 at 23108w5d being seen today for ongoing prenatal care.  She is currently monitored for the following issues for this high-risk pregnancy and has Supervision of normal pregnancy; BMI 33.0-33.9,adult; and Obesity affecting pregnancy on their problem list.  ----------------------------------------------------------------------------------- Patient reports no complaints.   Contractions: Not present. Vag. Bleeding: None.  Movement: Present. Denies leaking of fluid.  ----------------------------------------------------------------------------------- The following portions of the patient's history were reviewed and updated as appropriate: allergies, current medications, past family history, past medical history, past social history, past surgical history and problem list. Problem list updated.   Objective  Blood pressure 130/66, weight 192 lb (87.1 kg), last menstrual period 05/26/2017. Pregravid weight 179 lb (81.2 kg) Total Weight Gain 13 lb (5.897 kg) Urinalysis:      Fetal Status: Fetal Heart Rate (bpm): 135 Fundal Height: 28 cm Movement: Present     General:  Alert, oriented and cooperative. Patient is in no acute distress.  Skin: Skin is warm and dry. No rash noted.   Cardiovascular: Normal heart rate noted  Respiratory: Normal respiratory effort, no problems with respiration noted  Abdomen: Soft, gravid, appropriate for gestational age. Pain/Pressure: Absent     Pelvic:  Cervical exam deferred        Extremities: Normal range of motion.  Edema: None  Mental Status: Normal mood and affect. Normal behavior. Normal judgment and thought content.   Assessment   22 y.o. G2P1001 at 62108w5d by  03/02/2018, by Last Menstrual Period presenting for routine prenatal visit  Plan   SECOND Problems (from 07/04/17 to present)    Problem Noted Resolved   Obesity affecting pregnancy 10/11/2017 by Conard NovakJackson, Stephen D, MD  No   Supervision of normal pregnancy 07/04/2017 by Oswaldo ConroySchmid, Jacelyn Y, CNM No   Overview Addendum 10/04/2017  4:20 PM by Farrel ConnersGutierrez, Colleen, CNM    Clinic [facility] Prenatal Labs  Dating L=6 Blood type: A/Positive/-- (01/24 1119)   Genetic Screen 1 Screen: neg   AFP:  neg   Quad:     NIPS: Antibody:Negative (01/24 1119)  Anatomic US  Rubella: 1.51 (01/24 1119) Varicella: Non-Immune  GTT Early: 126               Third trimester:  RPR: Non Reactive (01/24 1119)   Rhogam N/A HBsAg: Negative (01/24 1119)   TDaP vaccine                       Flu Shot: HIV: Non Reactive (01/24 1119)   Baby Food                                GBS:   Contraception  Pap: 07/04/17, NILM  CBB     CS/VBAC    Support Person                  Preterm labor symptoms and general obstetric precautions including but not limited to vaginal bleeding, contractions, leaking of fluid and fetal movement were reviewed in detail with the patient. Please refer to After Visit Summary for other counseling recommendations.   Return in about 2 weeks (around 12/20/2017) for rob.  Tresea MallJane Xan Sparkman, CNM 12/06/2017 9:50 AM

## 2017-12-06 NOTE — Patient Instructions (Signed)
Third Trimester of Pregnancy The third trimester is from week 28 through week 40 (months 7 through 9). The third trimester is a time when the unborn baby (fetus) is growing rapidly. At the end of the ninth month, the fetus is about 20 inches in length and weighs 6-10 pounds. Body changes during your third trimester Your body will continue to go through many changes during pregnancy. The changes vary from woman to woman. During the third trimester:  Your weight will continue to increase. You can expect to gain 25-35 pounds (11-16 kg) by the end of the pregnancy.  You may begin to get stretch marks on your hips, abdomen, and breasts.  You may urinate more often because the fetus is moving lower into your pelvis and pressing on your bladder.  You may develop or continue to have heartburn. This is caused by increased hormones that slow down muscles in the digestive tract.  You may develop or continue to have constipation because increased hormones slow digestion and cause the muscles that push waste through your intestines to relax.  You may develop hemorrhoids. These are swollen veins (varicose veins) in the rectum that can itch or be painful.  You may develop swollen, bulging veins (varicose veins) in your legs.  You may have increased body aches in the pelvis, back, or thighs. This is due to weight gain and increased hormones that are relaxing your joints.  You may have changes in your hair. These can include thickening of your hair, rapid growth, and changes in texture. Some women also have hair loss during or after pregnancy, or hair that feels dry or thin. Your hair will most likely return to normal after your baby is born.  Your breasts will continue to grow and they will continue to become tender. A yellow fluid (colostrum) may leak from your breasts. This is the first milk you are producing for your baby.  Your belly button may stick out.  You may notice more swelling in your hands,  face, or ankles.  You may have increased tingling or numbness in your hands, arms, and legs. The skin on your belly may also feel numb.  You may feel short of breath because of your expanding uterus.  You may have more problems sleeping. This can be caused by the size of your belly, increased need to urinate, and an increase in your body's metabolism.  You may notice the fetus "dropping," or moving lower in your abdomen (lightening).  You may have increased vaginal discharge.  You may notice your joints feel loose and you may have pain around your pelvic bone.  What to expect at prenatal visits You will have prenatal exams every 2 weeks until week 36. Then you will have weekly prenatal exams. During a routine prenatal visit:  You will be weighed to make sure you and the baby are growing normally.  Your blood pressure will be taken.  Your abdomen will be measured to track your baby's growth.  The fetal heartbeat will be listened to.  Any test results from the previous visit will be discussed.  You may have a cervical check near your due date to see if your cervix has softened or thinned (effaced).  You will be tested for Group B streptococcus. This happens between 35 and 37 weeks.  Your health care provider may ask you:  What your birth plan is.  How you are feeling.  If you are feeling the baby move.  If you have had   any abnormal symptoms, such as leaking fluid, bleeding, severe headaches, or abdominal cramping.  If you are using any tobacco products, including cigarettes, chewing tobacco, and electronic cigarettes.  If you have any questions.  Other tests or screenings that may be performed during your third trimester include:  Blood tests that check for low iron levels (anemia).  Fetal testing to check the health, activity level, and growth of the fetus. Testing is done if you have certain medical conditions or if there are problems during the  pregnancy.  Nonstress test (NST). This test checks the health of your baby to make sure there are no signs of problems, such as the baby not getting enough oxygen. During this test, a belt is placed around your belly. The baby is made to move, and its heart rate is monitored during movement.  What is false labor? False labor is a condition in which you feel small, irregular tightenings of the muscles in the womb (contractions) that usually go away with rest, changing position, or drinking water. These are called Braxton Hicks contractions. Contractions may last for hours, days, or even weeks before true labor sets in. If contractions come at regular intervals, become more frequent, increase in intensity, or become painful, you should see your health care provider. What are the signs of labor?  Abdominal cramps.  Regular contractions that start at 10 minutes apart and become stronger and more frequent with time.  Contractions that start on the top of the uterus and spread down to the lower abdomen and back.  Increased pelvic pressure and dull back pain.  A watery or bloody mucus discharge that comes from the vagina.  Leaking of amniotic fluid. This is also known as your "water breaking." It could be a slow trickle or a gush. Let your health care provider know if it has a color or strange odor. If you have any of these signs, call your health care provider right away, even if it is before your due date. Follow these instructions at home: Medicines  Follow your health care provider's instructions regarding medicine use. Specific medicines may be either safe or unsafe to take during pregnancy.  Take a prenatal vitamin that contains at least 600 micrograms (mcg) of folic acid.  If you develop constipation, try taking a stool softener if your health care provider approves. Eating and drinking  Eat a balanced diet that includes fresh fruits and vegetables, whole grains, good sources of protein  such as meat, eggs, or tofu, and low-fat dairy. Your health care provider will help you determine the amount of weight gain that is right for you.  Avoid raw meat and uncooked cheese. These carry germs that can cause birth defects in the baby.  If you have low calcium intake from food, talk to your health care provider about whether you should take a daily calcium supplement.  Eat four or five small meals rather than three large meals a day.  Limit foods that are high in fat and processed sugars, such as fried and sweet foods.  To prevent constipation: ? Drink enough fluid to keep your urine clear or pale yellow. ? Eat foods that are high in fiber, such as fresh fruits and vegetables, whole grains, and beans. Activity  Exercise only as directed by your health care provider. Most women can continue their usual exercise routine during pregnancy. Try to exercise for 30 minutes at least 5 days a week. Stop exercising if you experience uterine contractions.  Avoid heavy   lifting.  Do not exercise in extreme heat or humidity, or at high altitudes.  Wear low-heel, comfortable shoes.  Practice good posture.  You may continue to have sex unless your health care provider tells you otherwise. Relieving pain and discomfort  Take frequent breaks and rest with your legs elevated if you have leg cramps or low back pain.  Take warm sitz baths to soothe any pain or discomfort caused by hemorrhoids. Use hemorrhoid cream if your health care provider approves.  Wear a good support bra to prevent discomfort from breast tenderness.  If you develop varicose veins: ? Wear support pantyhose or compression stockings as told by your healthcare provider. ? Elevate your feet for 15 minutes, 3-4 times a day. Prenatal care  Write down your questions. Take them to your prenatal visits.  Keep all your prenatal visits as told by your health care provider. This is important. Safety  Wear your seat belt at  all times when driving.  Make a list of emergency phone numbers, including numbers for family, friends, the hospital, and police and fire departments. General instructions  Avoid cat litter boxes and soil used by cats. These carry germs that can cause birth defects in the baby. If you have a cat, ask someone to clean the litter box for you.  Do not travel far distances unless it is absolutely necessary and only with the approval of your health care provider.  Do not use hot tubs, steam rooms, or saunas.  Do not drink alcohol.  Do not use any products that contain nicotine or tobacco, such as cigarettes and e-cigarettes. If you need help quitting, ask your health care provider.  Do not use any medicinal herbs or unprescribed drugs. These chemicals affect the formation and growth of the baby.  Do not douche or use tampons or scented sanitary pads.  Do not cross your legs for long periods of time.  To prepare for the arrival of your baby: ? Take prenatal classes to understand, practice, and ask questions about labor and delivery. ? Make a trial run to the hospital. ? Visit the hospital and tour the maternity area. ? Arrange for maternity or paternity leave through employers. ? Arrange for family and friends to take care of pets while you are in the hospital. ? Purchase a rear-facing car seat and make sure you know how to install it in your car. ? Pack your hospital bag. ? Prepare the baby's nursery. Make sure to remove all pillows and stuffed animals from the baby's crib to prevent suffocation.  Visit your dentist if you have not gone during your pregnancy. Use a soft toothbrush to brush your teeth and be gentle when you floss. Contact a health care provider if:  You are unsure if you are in labor or if your water has broken.  You become dizzy.  You have mild pelvic cramps, pelvic pressure, or nagging pain in your abdominal area.  You have lower back pain.  You have persistent  nausea, vomiting, or diarrhea.  You have an unusual or bad smelling vaginal discharge.  You have pain when you urinate. Get help right away if:  Your water breaks before 37 weeks.  You have regular contractions less than 5 minutes apart before 37 weeks.  You have a fever.  You are leaking fluid from your vagina.  You have spotting or bleeding from your vagina.  You have severe abdominal pain or cramping.  You have rapid weight loss or weight gain.    You have shortness of breath with chest pain.  You notice sudden or extreme swelling of your face, hands, ankles, feet, or legs.  Your baby makes fewer than 10 movements in 2 hours.  You have severe headaches that do not go away when you take medicine.  You have vision changes. Summary  The third trimester is from week 28 through week 40, months 7 through 9. The third trimester is a time when the unborn baby (fetus) is growing rapidly.  During the third trimester, your discomfort may increase as you and your baby continue to gain weight. You may have abdominal, leg, and back pain, sleeping problems, and an increased need to urinate.  During the third trimester your breasts will keep growing and they will continue to become tender. A yellow fluid (colostrum) may leak from your breasts. This is the first milk you are producing for your baby.  False labor is a condition in which you feel small, irregular tightenings of the muscles in the womb (contractions) that eventually go away. These are called Braxton Hicks contractions. Contractions may last for hours, days, or even weeks before true labor sets in.  Signs of labor can include: abdominal cramps; regular contractions that start at 10 minutes apart and become stronger and more frequent with time; watery or bloody mucus discharge that comes from the vagina; increased pelvic pressure and dull back pain; and leaking of amniotic fluid. This information is not intended to replace advice  given to you by your health care provider. Make sure you discuss any questions you have with your health care provider. Document Released: 05/31/2001 Document Revised: 11/12/2015 Document Reviewed: 08/07/2012 Elsevier Interactive Patient Education  2017 [employer]  

## 2017-12-07 LAB — 28 WEEK RH+PANEL
BASOS: 0 %
Basophils Absolute: 0 10*3/uL (ref 0.0–0.2)
EOS (ABSOLUTE): 0.2 10*3/uL (ref 0.0–0.4)
EOS: 2 %
Gestational Diabetes Screen: 171 mg/dL — ABNORMAL HIGH (ref 65–139)
HIV Screen 4th Generation wRfx: NONREACTIVE
Hematocrit: 31.5 % — ABNORMAL LOW (ref 34.0–46.6)
Hemoglobin: 10.8 g/dL — ABNORMAL LOW (ref 11.1–15.9)
IMMATURE GRANS (ABS): 0 10*3/uL (ref 0.0–0.1)
IMMATURE GRANULOCYTES: 0 %
LYMPHS: 22 %
Lymphocytes Absolute: 1.8 10*3/uL (ref 0.7–3.1)
MCH: 32.8 pg (ref 26.6–33.0)
MCHC: 34.3 g/dL (ref 31.5–35.7)
MCV: 96 fL (ref 79–97)
MONOCYTES: 5 %
Monocytes Absolute: 0.5 10*3/uL (ref 0.1–0.9)
Neutrophils Absolute: 6 10*3/uL (ref 1.4–7.0)
Neutrophils: 71 %
Platelets: 305 10*3/uL (ref 150–450)
RBC: 3.29 x10E6/uL — AB (ref 3.77–5.28)
RDW: 13.5 % (ref 12.3–15.4)
RPR Ser Ql: NONREACTIVE
WBC: 8.5 10*3/uL (ref 3.4–10.8)

## 2017-12-13 ENCOUNTER — Other Ambulatory Visit: Payer: Self-pay | Admitting: Maternal Newborn

## 2017-12-13 DIAGNOSIS — O99013 Anemia complicating pregnancy, third trimester: Secondary | ICD-10-CM

## 2017-12-13 DIAGNOSIS — R7309 Other abnormal glucose: Secondary | ICD-10-CM

## 2017-12-13 MED ORDER — CITRANATAL BLOOM 90-1 MG PO TABS: 1.0000 | ORAL_TABLET | Freq: Every day | ORAL | 6 refills | Status: DC

## 2017-12-13 NOTE — Progress Notes (Signed)
Ordered 3 hour GTT and sent Rx for prenatal vitamins with iron. Patient is aware.

## 2017-12-20 ENCOUNTER — Ambulatory Visit (INDEPENDENT_AMBULATORY_CARE_PROVIDER_SITE_OTHER): Payer: Medicaid Other | Admitting: Obstetrics and Gynecology

## 2017-12-20 ENCOUNTER — Encounter: Payer: Self-pay | Admitting: Obstetrics and Gynecology

## 2017-12-20 VITALS — BP 104/62 | Wt 192.0 lb

## 2017-12-20 DIAGNOSIS — Z23 Encounter for immunization: Secondary | ICD-10-CM | POA: Diagnosis not present

## 2017-12-20 DIAGNOSIS — R7309 Other abnormal glucose: Secondary | ICD-10-CM | POA: Insufficient documentation

## 2017-12-20 DIAGNOSIS — Z3A29 29 weeks gestation of pregnancy: Secondary | ICD-10-CM | POA: Diagnosis not present

## 2017-12-20 DIAGNOSIS — Z3483 Encounter for supervision of other normal pregnancy, third trimester: Secondary | ICD-10-CM

## 2017-12-20 DIAGNOSIS — O99013 Anemia complicating pregnancy, third trimester: Secondary | ICD-10-CM

## 2017-12-20 DIAGNOSIS — O26843 Uterine size-date discrepancy, third trimester: Secondary | ICD-10-CM

## 2017-12-20 NOTE — Progress Notes (Signed)
Routine Prenatal Care Visit  Subjective  Judith Beard is a 22 y.o. G2P1001 at [redacted]w[redacted]d being seen today for ongoing prenatal care.  She is currently monitored for the following issues for this low-risk pregnancy and has Supervision of normal pregnancy; BMI 33.0-33.9,adult; Obesity affecting pregnancy; Anemia affecting pregnancy in third trimester; Elevated glucose tolerance test; and Uterine size-date discrepancy in third trimester on their problem list.  ----------------------------------------------------------------------------------- Patient reports no complaints.   Contractions: Not present. Vag. Bleeding: None.  Movement: Present. Denies leaking of fluid.  ----------------------------------------------------------------------------------- The following portions of the patient's history were reviewed and updated as appropriate: allergies, current medications, past family history, past medical history, past social history, past surgical history and problem list. Problem list updated.   Objective  Blood pressure 104/62, weight 192 lb (87.1 kg), last menstrual period 05/26/2017. Pregravid weight 179 lb (81.2 kg) Total Weight Gain 13 lb (5.897 kg) Urinalysis: Urine Protein: Negative Urine Glucose: Negative  Fetal Status: Fetal Heart Rate (bpm): 139 Fundal Height: 36 cm Movement: Present     General:  Alert, oriented and cooperative. Patient is in no acute distress.  Skin: Skin is warm and dry. No rash noted.   Cardiovascular: Normal heart rate noted  Respiratory: Normal respiratory effort, no problems with respiration noted  Abdomen: Soft, gravid, appropriate for gestational age. Pain/Pressure: Absent     Pelvic:  Cervical exam deferred        Extremities: Normal range of motion.  Edema: None  ental Status: Normal mood and affect. Normal behavior. Normal judgment and thought content.     Assessment   22 y.o. G2P1001 at [redacted]w[redacted]d by  03/02/2018, by Last Menstrual Period  presenting for routine prenatal visit  Plan   SECOND Problems (from 07/04/17 to present)    Problem Noted Resolved   Elevated glucose tolerance test 12/20/2017 by Natale Milch, MD No   Uterine size-date discrepancy in third trimester 12/20/2017 by Natale Milch, MD No   Anemia affecting pregnancy in third trimester 12/13/2017 by Oswaldo Conroy, CNM No   Obesity affecting pregnancy 10/11/2017 by Conard Novak, MD No   Supervision of normal pregnancy 07/04/2017 by Oswaldo Conroy, CNM No   Overview Addendum 12/20/2017 10:49 AM by Natale Milch, MD    [facility]inic [facility] Prenatal Labs  Dating L=6 Blood type: A/Positive/-- (01/24 1119)   Genetic Screen 1 Screen: neg   AFP:  neg   Quad:     NIPS: Antibody:Negative (01/24 1119)  Anatomic [place]  Rubella: 1.51 (01/24 1119) Varicella: Non-Immune  GTT Early: 126               Third trimester:  RPR: Non Reactive (01/24 1119)   Rhogam N/A HBsAg: Negative (01/24 1119)   TDaP vaccine  12/20/17                      Flu Shot: HIV: Non Reactive (01/24 1119)   Baby Food   Bottle                             GBS:   Contraception Paraguard Pap: 07/04/17, NILM  CBB     CS/VBAC    Support Person                  Gestational age appropriate obstetric precautions including but not limited to vaginal bleeding, contractions, leaking of fluid and fetal movement were reviewed in detail  with the patient.    Patient is not fasting today and can not complete 3 hr GTT as scheduled. Reviewed the importance of identifying gestational diabetes because of the risks of fetal macrosomia, shoulder dystocia, and stillbirth. Asked patient to schedule ASAP. Tdap and blood consents signed today. Given information on birthcontrol options postpartum. Recommended [email or web address]. Patient desire paraguard Discussed breast feeding. Patient is planning on bottle feeding.    Return in about 2 weeks (around 01/03/2018) for ROB and US , ASAP GTTT.  Adelene Idlerhristanna  Shakemia Madera MD [facility] [address] [facility] 12/20/17 10:52 AM

## 2017-12-20 NOTE — Progress Notes (Signed)
ROB TDAP  

## 2018-01-03 ENCOUNTER — Ambulatory Visit (INDEPENDENT_AMBULATORY_CARE_PROVIDER_SITE_OTHER): Payer: Medicaid Other

## 2018-01-03 ENCOUNTER — Encounter: Payer: Self-pay | Admitting: Maternal Newborn

## 2018-01-03 ENCOUNTER — Other Ambulatory Visit: Payer: Medicaid Other

## 2018-01-03 ENCOUNTER — Ambulatory Visit (INDEPENDENT_AMBULATORY_CARE_PROVIDER_SITE_OTHER): Payer: Medicaid Other | Admitting: Maternal Newborn

## 2018-01-03 VITALS — BP 120/60 | Wt 196.0 lb

## 2018-01-03 DIAGNOSIS — Z3A31 31 weeks gestation of pregnancy: Secondary | ICD-10-CM | POA: Diagnosis not present

## 2018-01-03 DIAGNOSIS — O26843 Uterine size-date discrepancy, third trimester: Secondary | ICD-10-CM | POA: Diagnosis not present

## 2018-01-03 DIAGNOSIS — R7309 Other abnormal glucose: Secondary | ICD-10-CM

## 2018-01-03 DIAGNOSIS — Z3483 Encounter for supervision of other normal pregnancy, third trimester: Secondary | ICD-10-CM

## 2018-01-03 DIAGNOSIS — O99213 Obesity complicating pregnancy, third trimester: Secondary | ICD-10-CM

## 2018-01-03 DIAGNOSIS — O99013 Anemia complicating pregnancy, third trimester: Secondary | ICD-10-CM

## 2018-01-03 NOTE — Progress Notes (Signed)
Routine Prenatal Care Visit  Subjective  Judith Beard is a 22 y.o. G2P1001 at [redacted]w[redacted]d being seen today for ongoing prenatal care.  She is currently monitored for the following issues for this low-risk pregnancy and has Supervision of normal pregnancy; BMI 33.0-33.9,adult; Obesity affecting pregnancy; Anemia affecting pregnancy in third trimester; Elevated glucose tolerance test; and Uterine size-date discrepancy in third trimester on their problem list.  ----------------------------------------------------------------------------------- Patient reports no complaints.  She desires to schedule an induction of labor because she has family coming and they need to make travel arrangements. Discussed scheduling at 39 weeks but will wait for lab results as she is concerned about gestational diabetes and delivery timing. Contractions: Not present. Vag. Bleeding: None.  Movement: Present. No leaking of fluid.  ----------------------------------------------------------------------------------- The following portions of the patient's history were reviewed and updated as appropriate: allergies, current medications, past family history, past medical history, past social history, past surgical history and problem list. Problem list updated.   Objective  Blood pressure 120/60, weight 196 lb (88.9 kg), last menstrual period 05/26/2017. Pregravid weight 179 lb (81.2 kg) Total Weight Gain 17 lb (7.711 kg) Body mass index is 37.03 kg/m. Urinalysis: Urine Protein: Negative Urine Glucose: 3+  Fetal Status: Fetal Heart Rate (bpm): 149   Movement: Present     General:  Alert, oriented and cooperative. Patient is in no acute distress.  Skin: Skin is warm and dry. No rash noted.   Cardiovascular: Normal heart rate noted  Respiratory: Normal respiratory effort, no problems with respiration noted  Abdomen: Soft, gravid, appropriate for gestational age. Pain/Pressure: Absent     Pelvic:  Cervical exam  deferred        Extremities: Normal range of motion.     Mental Status: Normal mood and affect. Normal behavior. Normal judgment and thought content.     Assessment   22 y.o. G2P1001 at [redacted]w[redacted]d, EDD 03/02/2018 by Last Menstrual Period presenting for routine prenatal visit.  Plan   SECOND Problems (from 07/04/17 to present)    Problem Noted Resolved   Elevated glucose tolerance test 12/20/2017 by Natale Milch, MD No   Uterine size-date discrepancy in third trimester 12/20/2017 by Natale Milch, MD No   Anemia affecting pregnancy in third trimester 12/13/2017 by Oswaldo Conroy, CNM No   Obesity affecting pregnancy 10/11/2017 by Conard Novak, MD No   Supervision of normal pregnancy 07/04/2017 by Oswaldo Conroy, CNM No   Overview Addendum 12/20/2017 10:49 AM by Natale Milch, MD    [facility]inic [facility] Prenatal Labs  Dating L=6 Blood type: A/Positive/-- (01/24 1119)   Genetic Screen 1 Screen: neg   AFP:  neg   Quad:     NIPS: Antibody:Negative (01/24 1119)  Anatomic [place]  Rubella: 1.51 (01/24 1119) Varicella: Non-Immune  GTT Early: 126               Third trimester:  RPR: Non Reactive (01/24 1119)   Rhogam N/A HBsAg: Negative (01/24 1119)   TDaP vaccine  12/20/17                      Flu Shot: HIV: Non Reactive (01/24 1119)   Baby Food   Bottle                             GBS:   Contraception Paraguard Pap: 07/04/17, NILM  CBB     CS/VBAC  Support Person               Growth ultrasound today shows size=dates, EFW 3 lb 15 oz, 44th percentile, AFI normal at 12.79 cm.  Preterm labor symptoms and general obstetric precautions including but not limited to vaginal bleeding, contractions, leaking of fluid and fetal movement were reviewed.  Return in about 2 weeks (around 01/17/2018) for ROB.  Marcelyn BruinsJacelyn Katelinn Justice, CNM 01/03/2018  10:33 AM

## 2018-01-03 NOTE — Progress Notes (Signed)
No [email or web address].rj 

## 2018-01-04 LAB — GESTATIONAL GLUCOSE TOLERANCE
GLUCOSE 1 HOUR GTT: 154 mg/dL (ref 65–179)
GLUCOSE 2 HOUR GTT: 130 mg/dL (ref 65–154)
GLUCOSE 3 HOUR GTT: 122 mg/dL (ref 65–139)
GLUCOSE FASTING: 91 mg/dL (ref 65–94)

## 2018-01-09 ENCOUNTER — Telehealth: Payer: Self-pay

## 2018-01-09 NOTE — Telephone Encounter (Signed)
Pt inquiring about 3 hr gtt results. She is having difficulty getting into my chart. [id number]#[phone]

## 2018-01-09 NOTE — Telephone Encounter (Signed)
Chart reviewed. My chart msg from JS, CNM relayed to pt. Her 3 hr gtt is normal. She does not have GDM.

## 2018-01-17 ENCOUNTER — Encounter: Payer: Self-pay | Admitting: Advanced Practice Midwife

## 2018-01-17 ENCOUNTER — Ambulatory Visit (INDEPENDENT_AMBULATORY_CARE_PROVIDER_SITE_OTHER): Payer: Medicaid Other | Admitting: Advanced Practice Midwife

## 2018-01-17 VITALS — BP 108/58 | Wt 193.0 lb

## 2018-01-17 DIAGNOSIS — O99213 Obesity complicating pregnancy, third trimester: Secondary | ICD-10-CM

## 2018-01-17 DIAGNOSIS — O99013 Anemia complicating pregnancy, third trimester: Secondary | ICD-10-CM

## 2018-01-17 DIAGNOSIS — Z3A33 33 weeks gestation of pregnancy: Secondary | ICD-10-CM

## 2018-01-17 DIAGNOSIS — O26843 Uterine size-date discrepancy, third trimester: Secondary | ICD-10-CM

## 2018-01-17 NOTE — Progress Notes (Signed)
Routine Prenatal Care Visit  Subjective  Judith Beard is a 22 y.o. G2P1001 at [redacted]w[redacted]d being seen today for ongoing prenatal care.  She is currently monitored for the following issues for this high-risk pregnancy and has Supervision of normal pregnancy; BMI 33.0-33.9,adult; Obesity affecting pregnancy; Anemia affecting pregnancy in third trimester; Elevated glucose tolerance test; and Uterine size-date discrepancy in third trimester on their problem list.  ----------------------------------------------------------------------------------- Patient reports no complaints. She is wondering what the date of her elective induction will be. I told her that it will be as close to 39 weeks as possible but not before. She says her parents are planning their trip to be here with her.  Contractions: Not present. Vag. Bleeding: None.  Movement: Present. Denies leaking of fluid.  ----------------------------------------------------------------------------------- The following portions of the patient's history were reviewed and updated as appropriate: allergies, current medications, past family history, past medical history, past social history, past surgical history and problem list. Problem list updated.   Objective  Blood pressure (!) 108/58, weight 193 lb (87.5 kg), last menstrual period 05/26/2017. Pregravid weight 179 lb (81.2 kg) Total Weight Gain 14 lb (6.35 kg) Urinalysis:      Fetal Status: Fetal Heart Rate (bpm): 152 Fundal Height: 35 cm Movement: Present     General:  Alert, oriented and cooperative. Patient is in no acute distress.  Skin: Skin is warm and dry. No rash noted.   Cardiovascular: Normal heart rate noted  Respiratory: Normal respiratory effort, no problems with respiration noted  Abdomen: Soft, gravid, appropriate for gestational age. Pain/Pressure: Absent     Pelvic:  Cervical exam deferred        Extremities: Normal range of motion.  Edema: None  Mental Status: Normal  mood and affect. Normal behavior. Normal judgment and thought content.   Assessment   22 y.o. G2P1001 at [redacted]w[redacted]d by  03/02/2018, by Last Menstrual Period presenting for routine prenatal visit  Plan   SECOND Problems (from 07/04/17 to present)    Problem Noted Resolved   Elevated glucose tolerance test 12/20/2017 by Natale Milch, MD No   Uterine size-date discrepancy in third trimester 12/20/2017 by Natale Milch, MD No   Anemia affecting pregnancy in third trimester 12/13/2017 by Oswaldo Conroy, CNM No   Obesity affecting pregnancy 10/11/2017 by Conard Novak, MD No   Supervision of normal pregnancy 07/04/2017 by Oswaldo Conroy, CNM No   Overview Addendum 12/20/2017 10:49 AM by Natale Milch, MD    [facility]inic [facility] Prenatal Labs  Dating L=6 Blood type: A/Positive/-- (01/24 1119)   Genetic Screen 1 Screen: neg   AFP:  neg   Quad:     NIPS: Antibody:Negative (01/24 1119)  Anatomic [place]  Rubella: 1.51 (01/24 1119) Varicella: Non-Immune  GTT Early: 126               Third trimester:  RPR: Non Reactive (01/24 1119)   Rhogam N/A HBsAg: Negative (01/24 1119)   TDaP vaccine  12/20/17                      Flu Shot: HIV: Non Reactive (01/24 1119)   Baby Food   Bottle                             GBS:   Contraception Paraguard Pap: 07/04/17, NILM  CBB     CS/VBAC    Support Person  Preterm labor symptoms and general obstetric precautions including but not limited to vaginal bleeding, contractions, leaking of fluid and fetal movement were reviewed in detail with the patient.   Return in about 2 weeks (around 01/31/2018) for rob.  Tresea MallJane Kahron Kauth, CNM 01/17/2018 10:35 AM

## 2018-01-31 ENCOUNTER — Ambulatory Visit (INDEPENDENT_AMBULATORY_CARE_PROVIDER_SITE_OTHER): Payer: Medicaid Other | Admitting: Obstetrics and Gynecology

## 2018-01-31 ENCOUNTER — Encounter: Payer: Self-pay | Admitting: Obstetrics and Gynecology

## 2018-01-31 VITALS — BP 122/74 | Wt 194.0 lb

## 2018-01-31 DIAGNOSIS — Z3483 Encounter for supervision of other normal pregnancy, third trimester: Secondary | ICD-10-CM

## 2018-01-31 DIAGNOSIS — Z3A35 35 weeks gestation of pregnancy: Secondary | ICD-10-CM

## 2018-01-31 DIAGNOSIS — O99013 Anemia complicating pregnancy, third trimester: Secondary | ICD-10-CM

## 2018-01-31 DIAGNOSIS — Z6833 Body mass index (BMI) 33.0-33.9, adult: Secondary | ICD-10-CM

## 2018-01-31 DIAGNOSIS — O99213 Obesity complicating pregnancy, third trimester: Secondary | ICD-10-CM

## 2018-01-31 LAB — POCT URINALYSIS DIPSTICK OB
Glucose, UA: NEGATIVE — AB
POC,PROTEIN,UA: NEGATIVE

## 2018-01-31 NOTE — Patient Instructions (Signed)

## 2018-01-31 NOTE — Progress Notes (Signed)
Routine Prenatal Care Visit  Subjective  Judith Beard is a 22 y.o. G2P1001 at 4677w5d being seen today for ongoing prenatal care.  She is currently monitored for the following issues for this low-risk pregnancy and has Supervision of normal pregnancy; BMI 33.0-33.9,adult; Obesity affecting pregnancy; Anemia affecting pregnancy in third trimester; Elevated glucose tolerance test; and Uterine size-date discrepancy in third trimester on their problem list.  ----------------------------------------------------------------------------------- Patient reports no complaints.   Contractions: Not present. Vag. Bleeding: None.  Movement: Present. Denies leaking of fluid.  ----------------------------------------------------------------------------------- The following portions of the patient's history were reviewed and updated as appropriate: allergies, current medications, past family history, past medical history, past social history, past surgical history and problem list. Problem list updated.   Objective  Blood pressure 122/74, weight 194 lb (88 kg), last menstrual period 05/26/2017. Pregravid weight 179 lb (81.2 kg) Total Weight Gain 15 lb (6.804 kg) Urinalysis:      Fetal Status: Fetal Heart Rate (bpm): 140 Fundal Height: 37 cm Movement: Present     General:  Alert, oriented and cooperative. Patient is in no acute distress.  Skin: Skin is warm and dry. No rash noted.   Cardiovascular: Normal heart rate noted  Respiratory: Normal respiratory effort, no problems with respiration noted  Abdomen: Soft, gravid, appropriate for gestational age. Pain/Pressure: Absent     Pelvic:  Cervical exam deferred        Extremities: Normal range of motion.  Edema: None  Mental Status: Normal mood and affect. Normal behavior. Normal judgment and thought content.   Assessment   22 y.o. G2P1001 at 5077w5d by  03/02/2018, by Last Menstrual Period presenting for routine prenatal visit  Plan   SECOND  Problems (from 07/04/17 to present)    Problem Noted Resolved   Elevated glucose tolerance test 12/20/2017 by Natale MilchSchuman, Christanna R, MD No   Uterine size-date discrepancy in third trimester 12/20/2017 by Natale MilchSchuman, Christanna R, MD No   Anemia affecting pregnancy in third trimester 12/13/2017 by Oswaldo ConroySchmid, Jacelyn Y, CNM No   Obesity affecting pregnancy 10/11/2017 by Conard NovakJackson, Glorianna Gott D, MD No   Supervision of normal pregnancy 07/04/2017 by Oswaldo ConroySchmid, Jacelyn Y, CNM No   Overview Addendum 12/20/2017 10:49 AM by Natale MilchSchuman, Christanna R, MD    [facility] Prenatal Labs  Dating L=6 Blood type: A/Positive/-- (01/24 1119)   Genetic Screen 1 Screen: neg   AFP:  neg   Quad:     NIPS: Antibody:Negative (01/24 1119)  Anatomic US  Rubella: 1.51 (01/24 1119) Varicella: Non-Immune  GTT Early: 126               Third trimester:  RPR: Non Reactive (01/24 1119)   Rhogam N/A HBsAg: Negative (01/24 1119)   TDaP vaccine  12/20/17                      Flu Shot: HIV: Non Reactive (01/24 1119)   Baby Food   Bottle                             GBS:   Contraception Paraguard Pap: 07/04/17, NILM  CBB     CS/VBAC    Support Person                  Preterm labor symptoms and general obstetric precautions including but not limited to vaginal bleeding, contractions, leaking of fluid and fetal movement were reviewed in detail with the patient. Please  refer to After Visit Summary for other counseling recommendations.   -GBS/Aptima next visit  Return in about 1 week (around 02/07/2018) for Routine Prenatal Appointment.  Thomasene MohairStephen Franklin Clapsaddle, MD, Merlinda FrederickFACOG [facility], [facility] 01/31/2018 11:58 AM

## 2018-02-06 ENCOUNTER — Inpatient Hospital Stay: Admission: RE | Admit: 2018-02-06 | Payer: Medicaid Other | Source: Ambulatory Visit

## 2018-02-07 ENCOUNTER — Other Ambulatory Visit (HOSPITAL_COMMUNITY)
Admission: RE | Admit: 2018-02-07 | Discharge: 2018-02-07 | Disposition: A | Discharge status: Home / Self Care | Payer: Medicaid Other | Source: Ambulatory Visit | Attending: Advanced Practice Midwife | Admitting: Advanced Practice Midwife

## 2018-02-07 ENCOUNTER — Encounter: Payer: Self-pay | Admitting: Advanced Practice Midwife

## 2018-02-07 ENCOUNTER — Ambulatory Visit (INDEPENDENT_AMBULATORY_CARE_PROVIDER_SITE_OTHER): Payer: Medicaid Other | Admitting: Advanced Practice Midwife

## 2018-02-07 VITALS — BP 98/58 | Wt 194.0 lb

## 2018-02-07 DIAGNOSIS — Z113 Encounter for screening for infections with a predominantly sexual mode of transmission: Secondary | ICD-10-CM | POA: Insufficient documentation

## 2018-02-07 DIAGNOSIS — Z3A36 36 weeks gestation of pregnancy: Secondary | ICD-10-CM

## 2018-02-07 DIAGNOSIS — Z3685 Encounter for antenatal screening for Streptococcus B: Secondary | ICD-10-CM

## 2018-02-07 LAB — POCT URINALYSIS DIPSTICK OB
GLUCOSE, UA: NEGATIVE — AB
POC,PROTEIN,UA: NEGATIVE

## 2018-02-07 NOTE — Progress Notes (Signed)
Routine Prenatal Care Visit  Subjective  Judith Beard is a 22 y.o. G2P1001 at 7841w5d being seen today for ongoing prenatal care.  She is currently monitored for the following issues for this high-risk pregnancy and has Supervision of normal pregnancy; BMI 33.0-33.9,adult; Obesity affecting pregnancy; Anemia affecting pregnancy in third trimester; Elevated glucose tolerance test; and Uterine size-date discrepancy in third trimester on their problem list.  ----------------------------------------------------------------------------------- Patient reports pelvic pressure.   Contractions: Not present. Vag. Bleeding: None.  Movement: Present. Denies leaking of fluid.  ----------------------------------------------------------------------------------- The following portions of the patient's history were reviewed and updated as appropriate: allergies, current medications, past family history, past medical history, past social history, past surgical history and problem list. Problem list updated.   Objective  Blood pressure (!) 98/58, weight 194 lb (88 kg), last menstrual period 05/26/2017. Pregravid weight 179 lb (81.2 kg) Total Weight Gain 15 lb (6.804 kg) Urinalysis:      Fetal Status: Fetal Heart Rate (bpm): 138 Fundal Height: 38 cm Movement: Present     General:  Alert, oriented and cooperative. Patient is in no acute distress.  Skin: Skin is warm and dry. No rash noted.   Cardiovascular: Normal heart rate noted  Respiratory: Normal respiratory effort, no problems with respiration noted  Abdomen: Soft, gravid, appropriate for gestational age. Pain/Pressure: Present     Pelvic:  GBS/aptima collected. Cervix appears closed.        Extremities: Normal range of motion.  Edema: None  Mental Status: Normal mood and affect. Normal behavior. Normal judgment and thought content.   Assessment   22 y.o. G2P1001 at 5741w5d by  03/02/2018, by Last Menstrual Period presenting for routine prenatal  visit  Plan   SECOND Problems (from 07/04/17 to present)    Problem Noted Resolved   Elevated glucose tolerance test 12/20/2017 by Natale MilchSchuman, Christanna R, MD No   Uterine size-date discrepancy in third trimester 12/20/2017 by Natale MilchSchuman, Christanna R, MD No   Anemia affecting pregnancy in third trimester 12/13/2017 by Oswaldo ConroySchmid, Jacelyn Y, CNM No   Obesity affecting pregnancy 10/11/2017 by Conard NovakJackson, Stephen D, MD No   Supervision of normal pregnancy 07/04/2017 by Oswaldo ConroySchmid, Jacelyn Y, CNM No   Overview Addendum 12/20/2017 10:49 AM by Natale MilchSchuman, Christanna R, MD    [facility] Prenatal Labs  Dating L=6 Blood type: A/Positive/-- (01/24 1119)   Genetic Screen 1 Screen: neg   AFP:  neg   Quad:     NIPS: Antibody:Negative (01/24 1119)  Anatomic US  Rubella: 1.51 (01/24 1119) Varicella: Non-Immune  GTT Early: 126               Third trimester:  RPR: Non Reactive (01/24 1119)   Rhogam N/A HBsAg: Negative (01/24 1119)   TDaP vaccine  12/20/17                      Flu Shot: HIV: Non Reactive (01/24 1119)   Baby Food   Bottle                             GBS:   Contraception Paraguard Pap: 07/04/17, NILM  CBB     CS/VBAC    Support Person                  Preterm labor symptoms and general obstetric precautions including but not limited to vaginal bleeding, contractions, leaking of fluid and fetal movement were reviewed in detail with  the patient. Please refer to After Visit Summary for other counseling recommendations.   Return in about 1 week (around 02/14/2018) for rob.  Tresea MallJane Chinwe Lope, CNM 02/07/2018 10:47 AM

## 2018-02-07 NOTE — Patient Instructions (Signed)
Parto vaginal Vaginal Delivery Parto vaginal significa que usted dar a luz empujando al beb fuera del canal del parto (vagina). Un equipo de proveedores de atencin mdica la ayudar antes, durante y despus del parto vaginal. Las experiencias de los nacimientos son nicas para todas las mujeres y cada embarazo y las experiencias de nacimiento varan segn dnde elija dar a luz. Qu debo hacer a fin de prepararme para el nacimiento de mi beb? Antes del nacimiento de su beb, es importante que hable con su mdico sobre lo siguiente:  Sus preferencias en cuanto al trabajo de parto y parto. Estas pueden incluir lo siguiente: ? Medicamentos que le puedan administrar. ? Cmo controlar el dolor. Esto podra incluir tcnicas de alivio del dolor no mdicas o medicamentos inyectables para aliviar el dolor como la analgesia epidural. ? Cmo se los controlar a usted y a su beb durante el trabajo de parto y el parto. ? Quin puede estar en la sala de trabajo de parto y parto con usted. ? Sus opiniones en cuanto al parto quirrgico de su beb (parto por cesrea o cesrea) si esto fuera necesario. ? Sus opiniones en cuanto a recibir sangre donada a travs de un tubo (catter) intravenoso (transfusin de sangre) si esto fuera necesario.  Si usted puede: ? Tomar fotografas o videos del nacimiento. ? Comer durante el trabajo de parto y el parto. ? Moverse, caminar o cambiar de posicin durante el trabajo de parto y el parto.  Qu esperar despus del nacimiento de su beb, como: ? Si se ofrece el pinzamiento y corte tardo del cordn umbilical. ? Quin cuidar a su beb inmediatamente despus del nacimiento. ? Medicamentos o pruebas que pueden recomendarse para su beb. ? Si su hospital o centro de parto apoya la lactancia. ? Cunto tiempo estar en el hospital o en el centro de parto.  Cmo cualquier afeccin mdica que usted tenga puede afectar a su beb o la experiencia de trabajo de parto y  parto.  A fin de prepararse para el nacimiento de su beb, usted tambin debe:  Asistir a todas sus visitas de atencin mdica antes del parto (visitas prenatales) segn lo recomendado por su mdico. Esto es importante.  Preparacin del hogar para la llegada de su beb. Asegrese de tener lo siguiente: ? Paales. ? Ropa de beb. ? Equipo de alimentacin. ? Haga preparativos para que usted y su beb puedan dormir de manera segura.  Instale un asiento de beb en su vehculo. Haga verificar el asiento de beb de su coche por un instalador de asientos de beb para asegurarse de que est instalado en forma segura.  Piense en quin la ayudar con su nuevo beb en el hogar durante al menos las primeras semanas despus del parto.  Qu puedo esperar cuando llegue al centro de parto o el hospital? Una vez que se inicie el trabajo de parto y haya sido admitida en el hospital o centro de parto, el mdico podr hacer lo siguiente:  Revisar sus antecedentes de embarazo y cualquier inquietud que usted pueda tener.  Colocar un tubo (catter) intravenoso en una de sus venas. Esto se usa para administrarle lquidos y medicamentos.  Verificar su presin arterial, temperatura y pulso y la frecuencia cardaca (signos vitales).  Verificar si la bolsa de agua (saco amnitico) se ha roto (ruptura).  Hablar con usted sobre su plan de nacimiento y analizar las opciones para controlar el dolor.  Monitoreo Su mdico puede monitorear las contracciones (monitoreo uterino) y   el ritmo cardaco del beb (monitoreo fetal). Es posible que el monitoreo se necesite realizar:  Con frecuencia, pero no continuamente (intermitentemente).  Todo el tiempo o durante largos perodos a la vez (continuamente). El monitoreo continuo puede ser necesario si: ? Usted est recibiendo determinados medicamentos, tales como medicamentos para aliviar el dolor o para hacer que las contracciones ms fuertes. ? Tiene complicaciones  durante el embarazo o el trabajo de parto.  El monitoreo se puede realizar:  Al colocar un estetoscopio especial o un dispositivo manual de monitoreo en el abdomen o verificar los latidos cardacos de su beb y sentir su abdomen para controlar de contracciones. Este mtodo de monitoreo no registra los latidos cardacos de su beb ni sus contracciones de manera continua.  Colocar monitores en el abdomen (monitores externos) para registrar los latidos cardacos de su beb y la frecuencia y duracin de las contracciones. No tendr que tener colocados los monitores externos en todo momento.  Colocar monitores dentro del tero (monitores internos) para registrar los latidos cardacos de su beb y la frecuencia, duracin y fuerza de sus contracciones. ? Su mdico podr usar monitores internos si necesita ms informacin sobre la fuerza de las contracciones o la frecuencia cardaca del beb. ? Los monitores internos se colocan pasando un cable delgado y flexible a travs de la vagina hasta el tero. Segn el tipo de monitor, puede permanecer en el tero o en la cabeza del beb hasta el nacimiento. ? Su mdico analizar con usted los beneficios y los riesgos de usar un monitor interno y le pedir permiso antes de colocar los monitores.  Telemetra. Se trata de un tipo de monitoreo continuo que se puede realizar con monitores externos o internos. En lugar de tener que permanecer en la cama, usted puede moverse durante la telemetra. Pregunte a su mdico si la telemetra es una opcin para usted.  Examen fsico. Su mdico puede realizarle un examen fsico. Esto puede incluir lo siguiente:  Verificar en qu posicin se encuentra su beb: ? Con la cabeza hacia la vagina (cabeza abajo). Esta es la posicin ms frecuente. ? Con la cabeza hacia la parte superior del tero (cabeza arriba o de nalgas). Si su beb est en una posicin de nalgas, su mdico puede tratar de hacerlo girar para que quede cabeza abajo a  fin de poder tener un parto vaginal. Si parece que su beb no puede nacer con parto vaginal, su mdico puede recomendar una ciruga para dar a luz al beb. En casos muy poco frecuentes, usted puede dar a luz con parto vaginal si el beb est cabeza arriba (parto de nalgas). ? Posicin lateral (transversal). Los bebs que estn en posicin lateral no pueden nacer por parto vaginal.  Verificar el cuello uterino para determinar: ? Si se est afinando o estirando (borrando). ? Si se est abriendo (dilatando). ? Cunto se ha movido o ha bajado su beb por el canal del parto.  Cules son las tres etapas del trabajo de parto y el parto?  El trabajo de parto y el parto normales se dividen en tres etapas: Etapa 1  La etapa 1 es la etapa ms larga del trabajo de parto y puede durar horas o das. La etapa 1 incluye: ? Trabajo de parto temprano. Esto es cuando las contracciones pueden ser irregulares o regulares y leves. En general, las contracciones del trabajo de parto temprano se producen con ms de 10 minutos de diferencia. ? Trabajo de parto activo. Esto es cuando las   contracciones son ms largas, ms regulares, ms frecuentes y ms intensas. ? La fase de transicin. Esto es cuando las contracciones ocurren muy seguidas, son muy intensas y pueden durar ms que durante cualquier otra parte del trabajo de parto.  En general, las contracciones son leves, infrecuentes e irregulares al principio. Se hacen ms fuertes, ms frecuentes (aproximadamente cada 2 o 3 minutos) y ms regulares a medida que usted avanza desde un trabajo de parto temprano hasta un trabajo de parto activo y fase de transicin.  Muchas mujeres progresan a travs de la etapa 1 de forma natural, pero es posible que usted necesite ayuda para continuar avanzando. Si esto ocurre, su mdico puede hablar con usted sobre lo siguiente: ? La ruptura del saco amnitico si es que an no ha ocurrido. ? Administracin de medicamentos para ayudarla  a tener contracciones ms fuertes y ms frecuentes.  La etapa 1 finaliza cuando el cuello uterino est completamente dilatado hasta 4 pulgadas (10cm) y completamente borrado. Esto ocurre al final de la fase de transicin. Etapa 2  Una vez que el cuello uterino est totalmente borrado y dilatado a 4 pulgadas (10cm), usted puede comenzar a sentir ganas de pujar. Es comn que el cuerpo tome un descanso de manera natural antes de sentir ganas de pujar, especialmente si recibe una epidural u otros medicamentos para el dolor. Este perodo de descanso puede durar un mximo de 1 a 2 horas, segn su experiencia de parto nica.  Durante la etapa 2, las contracciones son generalmente menos doloras porque pujar ayuda a aliviar el dolor de las contracciones. En lugar del dolor de las contracciones, puede sentir un dolor urente y por estiramiento, especialmente cuando la parte ms ancha de la cabeza de su beb pasa a travs de la abertura vaginal (coronacin).  Su mdico controlar atentamente su avance con los pujos y el avance del beb a travs de la vagina durante la etapa 2.  Su mdico puede masajear el rea de la piel entre la abertura vaginal y el ano (perineo) o aplicar compresas tibias en el perineo. Esto ayuda al estiramiento ya que la cabeza del beb empieza a aparecer, lo cual puede ayudar a evitar un desgarro perineal. ? En algunos casos, se puede hacer una incisin en el peritoneo (episiotoma) para permitir que el beb pase a travs de la abertura vaginal. La episiotoma sirve para agrandar la abertura vaginal a fin de que el beb tenga ms espacio para pasar durante el parto.  Es muy importante respirar y concentrarse para que el mdico pueda controlar la salida de la cabeza del beb. Es posible que su mdico tenga que disminuir la intensidad de los pujos para ayudar a evitar un desgarro perineal.  Despus de que sale la cabeza del beb, en general salen los hombros y el resto del cuerpo muy  rpidamente y sin dificultad.  Una vez que el beb nace, se debe cortar el cordn umbilical de inmediato o esto puede demorar 1 o 2 minutos, segn la salud del beb. Este procedimiento puede variar segn el mdico, el hospital y el centro de parto.  Si usted y su beb estn lo suficientemente sanos, se le colocar el beb en el pecho o el abdomen para ayudar a mantener la temperatura del beb y el vnculo entre ustedes. Algunas madres y bebs comienzan la lactancia en este momento. Su equipo mdico secar al beb y lo ayudar a mantenerse caliente durante este tiempo.  Es posible que su beb necesite atencin   inmediata si: ? Mostr signos de sufrimiento durante el trabajo de parto. ? Tiene una afeccin mdica. ? Naci antes de tiempo (prematuro). ? Defeca antes del nacimiento (meconio). ? Muestra signos de dificultar en la transicin de estar dentro del tero a estar fuera del tero. Si tiene planeado amamantar, su equipo mdico la ayudar a comenzar la lactancia. Etapa 3  La tercera etapa del trabajo de parto comienza inmediatamente despus del nacimiento de su beb y finaliza despus de la expulsin de la placenta. La placenta es un rgano de que desarrolla durante el embarazo para proporcionar oxgeno y nutrientes al beb en el tero.  La expulsin de la placenta puede requerir algunos pujos y es posible que usted tenga contracciones leves. La lactancia puede estimular las contracciones para ayudar a expulsar la placenta.  Luego de la expulsin de la placenta, el tero debe (contraerse) y quedar muy firme. Esto ayuda a detener el sangrado en el tero. Para ayudar al tero a contraerse y controlar el sangrado, su mdico puede: ? Administrarle un medicamento inyectable, a travs de un tubo (catter) intravenoso, por boca o a travs del recto (por va rectal). ? Masajear el abdomen o realizar un examen de la vagina para extraer cualquier cogulo de sangre que quede en el tero. ? Vaciar la  vejiga colocando un tubo flexible (catter) en la vejiga. ? Alentarla a amamantar a su beb. Una vez que termina el trabajo de parto, se los controlar a usted y a su beb atentamente para tener la seguridad de que ambos estn sanos hasta que estn listos para ir a casa. Su equipo de atencin mdica le ensear cmo cuidarse y cuidar a su beb. Esta informacin no tiene como fin reemplazar el consejo del mdico. Asegrese de hacerle al mdico cualquier pregunta que tenga. Document Released: 05/19/2008 Document Revised: 09/15/2016 Document Reviewed: 06/21/2015 Elsevier Interactive Patient Education  2018 [employer]  

## 2018-02-07 NOTE — Progress Notes (Signed)
ROB- Pt describes having the feeling of pressure between her legs. Declines flu shot

## 2018-02-08 LAB — CERVICOVAGINAL ANCILLARY ONLY
CHLAMYDIA, DNA PROBE: NEGATIVE
Neisseria Gonorrhea: NEGATIVE
TRICH (WINDOWPATH): NEGATIVE

## 2018-02-11 LAB — STREP GP B CULTURE+RFLX: STREP GP B CULTURE+RFLX: NEGATIVE

## 2018-02-14 ENCOUNTER — Ambulatory Visit (INDEPENDENT_AMBULATORY_CARE_PROVIDER_SITE_OTHER): Payer: Medicaid Other | Admitting: Obstetrics & Gynecology

## 2018-02-14 VITALS — BP 120/80 | Wt 196.0 lb

## 2018-02-14 DIAGNOSIS — Z3A37 37 weeks gestation of pregnancy: Secondary | ICD-10-CM

## 2018-02-14 DIAGNOSIS — O99213 Obesity complicating pregnancy, third trimester: Secondary | ICD-10-CM

## 2018-02-14 DIAGNOSIS — Z3483 Encounter for supervision of other normal pregnancy, third trimester: Secondary | ICD-10-CM

## 2018-02-14 LAB — POCT URINALYSIS DIPSTICK OB
Glucose, UA: NEGATIVE
POC,PROTEIN,UA: NEGATIVE

## 2018-02-14 NOTE — Patient Instructions (Signed)

## 2018-02-14 NOTE — Progress Notes (Signed)
  Subjective  Fetal Movement? yes Contractions? no Leaking Fluid? no Vaginal Bleeding? no  Objective  BP 120/80   Wt 196 lb (88.9 kg)   LMP 05/26/2017 (Exact Date)   BMI 37.03 kg/m  General: NAD Pumonary: no increased work of breathing Abdomen: gravid, non-tender Extremities: no edema Psychiatric: mood appropriate, affect full  Assessment  22 y.o. G2P1001 at 2811w5d by  03/02/2018, by Last Menstrual Period presenting for routine prenatal visit  Plan   Problem List Items Addressed This Visit      Other   Supervision of normal pregnancy   Obesity affecting pregnancy    Other Visit Diagnoses    [redacted] weeks gestation of pregnancy    -  Primary    [facility], Labor precautions  Annamarie MajorPaul Bailyn Spackman, MD, Merlinda FrederickFACOG [facility], [facility] 02/14/2018  11:20 AM

## 2018-02-14 NOTE — Addendum Note (Signed)
Addended by: Cornelius MorasPATTERSON, Oree Hislop D on: 02/14/2018 11:32 AM   Modules accepted: Orders

## 2018-02-21 ENCOUNTER — Ambulatory Visit (INDEPENDENT_AMBULATORY_CARE_PROVIDER_SITE_OTHER): Payer: Medicaid Other | Admitting: Obstetrics & Gynecology

## 2018-02-21 VITALS — BP 120/80 | Wt 199.0 lb

## 2018-02-21 DIAGNOSIS — O9989 Other specified diseases and conditions complicating pregnancy, childbirth and the puerperium: Secondary | ICD-10-CM

## 2018-02-21 DIAGNOSIS — Z3483 Encounter for supervision of other normal pregnancy, third trimester: Secondary | ICD-10-CM

## 2018-02-21 DIAGNOSIS — O99213 Obesity complicating pregnancy, third trimester: Secondary | ICD-10-CM

## 2018-02-21 DIAGNOSIS — Z3A38 38 weeks gestation of pregnancy: Secondary | ICD-10-CM

## 2018-02-21 DIAGNOSIS — Z6837 Body mass index (BMI) 37.0-37.9, adult: Secondary | ICD-10-CM

## 2018-02-21 DIAGNOSIS — R350 Frequency of micturition: Secondary | ICD-10-CM

## 2018-02-21 LAB — POCT URINALYSIS DIPSTICK OB
BILIRUBIN UA: NEGATIVE
Blood, UA: NEGATIVE
GLUCOSE, UA: NEGATIVE
Glucose, UA: NEGATIVE
Ketones, UA: NEGATIVE
Leukocytes, UA: NEGATIVE
Nitrite, UA: NEGATIVE
POC,PROTEIN,UA: NEGATIVE
PROTEIN: NEGATIVE
Spec Grav, UA: 1.01 (ref 1.010–1.025)
UROBILINOGEN UA: 1 U/dL
pH, UA: 5 (ref 5.0–8.0)

## 2018-02-21 NOTE — Progress Notes (Signed)
  Subjective  Fetal Movement? yes Contractions? no Leaking Fluid? no Vaginal Bleeding? no Some increased urinary frequency and pressure Objective  BP 120/80   Wt 199 lb (90.3 kg)   LMP 05/26/2017 (Exact Date)   BMI 37.60 kg/m  General: NAD Pumonary: no increased work of breathing Abdomen: gravid, non-tender Extremities: no edema Psychiatric: mood appropriate, affect full  Results for orders placed or performed in visit on 02/21/18  POC Urinalysis Dipstick OB  Result Value Ref Range   Color, UA     Clarity, UA     Glucose, UA Negative Negative   Bilirubin, UA neg    Ketones, UA neg    Spec Grav, UA 1.010 1.010 - 1.025   Blood, UA neg    pH, UA 5.0 5.0 - 8.0   POC Protein UA Negative Negative, Trace   Urobilinogen, UA 1.0 0.2 or 1.0 E.U./dL   Nitrite, UA neg    Leukocytes, UA Negative Negative   Appearance     Odor    POC Urinalysis Dipstick OB  Result Value Ref Range   Color, UA     Clarity, UA     Glucose, UA Negative Negative   Bilirubin, UA     Ketones, UA     Spec Grav, UA     Blood, UA     pH, UA     POC Protein UA Negative Negative, Trace   Urobilinogen, UA     Nitrite, UA     Leukocytes, UA     Appearance     Odor     Assessment  22 y.o. G2P1001 at [redacted]w[redacted]d by  03/02/2018, by Last Menstrual Period presenting for routine prenatal visit  Plan   Problem List Items Addressed This Visit      Other   Supervision of normal pregnancy   Obesity affecting pregnancy    Other Visit Diagnoses    Urinary frequency    -  Primary   Relevant Orders   POC Urinalysis Dipstick OB (Completed)   [redacted] weeks gestation of pregnancy       Relevant Orders   POC Urinalysis Dipstick OB (Completed)    No sign of UTI Monitor for labor Exam nv  Annamarie Major, MD, [facility], [facility] 02/21/2018  11:34 AM

## 2018-02-22 ENCOUNTER — Other Ambulatory Visit: Payer: Self-pay | Admitting: Obstetrics and Gynecology

## 2018-02-22 ENCOUNTER — Observation Stay
Admission: EM | Admit: 2018-02-22 | Discharge: 2018-02-22 | Disposition: A | Discharge status: Home / Self Care | Payer: Medicaid Other | Attending: Certified Nurse Midwife | Admitting: Certified Nurse Midwife

## 2018-02-22 ENCOUNTER — Inpatient Hospital Stay: Payer: Medicaid Other | Admitting: Anesthesiology

## 2018-02-22 ENCOUNTER — Inpatient Hospital Stay
Admission: EM | Admit: 2018-02-22 | Discharge: 2018-02-24 | DRG: 807 | Disposition: A | Discharge status: Home / Self Care | Payer: Medicaid Other | Attending: Obstetrics and Gynecology | Admitting: Obstetrics and Gynecology

## 2018-02-22 ENCOUNTER — Other Ambulatory Visit: Payer: Self-pay

## 2018-02-22 DIAGNOSIS — Z3A38 38 weeks gestation of pregnancy: Secondary | ICD-10-CM | POA: Diagnosis not present

## 2018-02-22 DIAGNOSIS — Z6837 Body mass index (BMI) 37.0-37.9, adult: Secondary | ICD-10-CM | POA: Diagnosis not present

## 2018-02-22 DIAGNOSIS — Z3483 Encounter for supervision of other normal pregnancy, third trimester: Secondary | ICD-10-CM | POA: Diagnosis present

## 2018-02-22 DIAGNOSIS — D649 Anemia, unspecified: Secondary | ICD-10-CM | POA: Diagnosis present

## 2018-02-22 DIAGNOSIS — O99213 Obesity complicating pregnancy, third trimester: Secondary | ICD-10-CM

## 2018-02-22 DIAGNOSIS — Z91040 Latex allergy status: Secondary | ICD-10-CM | POA: Diagnosis not present

## 2018-02-22 DIAGNOSIS — R7309 Other abnormal glucose: Secondary | ICD-10-CM

## 2018-02-22 DIAGNOSIS — O26843 Uterine size-date discrepancy, third trimester: Secondary | ICD-10-CM

## 2018-02-22 DIAGNOSIS — E669 Obesity, unspecified: Secondary | ICD-10-CM | POA: Diagnosis not present

## 2018-02-22 DIAGNOSIS — O99214 Obesity complicating childbirth: Secondary | ICD-10-CM | POA: Diagnosis present

## 2018-02-22 DIAGNOSIS — O471 False labor at or after 37 completed weeks of gestation: Secondary | ICD-10-CM | POA: Diagnosis present

## 2018-02-22 DIAGNOSIS — Z880 Allergy status to penicillin: Secondary | ICD-10-CM | POA: Diagnosis not present

## 2018-02-22 DIAGNOSIS — O9902 Anemia complicating childbirth: Secondary | ICD-10-CM | POA: Diagnosis present

## 2018-02-22 DIAGNOSIS — O99013 Anemia complicating pregnancy, third trimester: Secondary | ICD-10-CM

## 2018-02-22 HISTORY — DX: Obesity, unspecified: E66.9

## 2018-02-22 HISTORY — DX: Anemia, unspecified: D64.9

## 2018-02-22 LAB — TYPE AND SCREEN
ABO/RH(D): A POS
ABO/RH(D): A POS
ANTIBODY SCREEN: NEGATIVE
Antibody Screen: NEGATIVE

## 2018-02-22 LAB — CBC
HCT: 32.8 % — ABNORMAL LOW (ref 35.0–47.0)
HEMATOCRIT: 32.8 % — AB (ref 35.0–47.0)
Hemoglobin: 11.4 g/dL — ABNORMAL LOW (ref 12.0–16.0)
Hemoglobin: 11.2 g/dL — ABNORMAL LOW (ref 12.0–16.0)
MCH: 32.6 pg (ref 26.0–34.0)
MCH: 31.8 pg (ref 26.0–34.0)
MCHC: 34.8 g/dL (ref 32.0–36.0)
MCHC: 34 g/dL (ref 32.0–36.0)
MCV: 93.6 fL (ref 80.0–100.0)
MCV: 93.6 fL (ref 80.0–100.0)
PLATELETS: 294 10*3/uL (ref 150–440)
Platelets: 292 10*3/uL (ref 150–440)
RBC: 3.51 MIL/uL — ABNORMAL LOW (ref 3.80–5.20)
RBC: 3.51 MIL/uL — ABNORMAL LOW (ref 3.80–5.20)
RDW: 14.9 % — AB (ref 11.5–14.5)
RDW: 15 % — AB (ref 11.5–14.5)
WBC: 10 10*3/uL (ref 3.6–11.0)
WBC: 10.6 10*3/uL (ref 3.6–11.0)

## 2018-02-22 LAB — CHLAMYDIA/NGC RT PCR (ARMC ONLY)
Chlamydia Tr: NOT DETECTED
N GONORRHOEAE: NOT DETECTED

## 2018-02-22 MED ORDER — OXYTOCIN 40 UNITS IN LACTATED RINGERS INFUSION - SIMPLE MED
INTRAVENOUS | Status: AC
  Administered 2018-02-23: 500 mL/h
  Filled 2018-02-22: qty 1000

## 2018-02-22 MED ORDER — MISOPROSTOL 200 MCG PO TABS
ORAL_TABLET | ORAL | Status: AC
  Filled 2018-02-22: qty 4

## 2018-02-22 MED ORDER — DIPHENHYDRAMINE HCL 50 MG/ML IJ SOLN: 12.5000 mg | INTRAMUSCULAR | Status: DC | PRN

## 2018-02-22 MED ORDER — OXYTOCIN BOLUS FROM INFUSION: 500.0000 mL | Freq: Once | INTRAVENOUS | Status: DC

## 2018-02-22 MED ORDER — AMMONIA AROMATIC IN INHA: 0.3000 mL | Freq: Once | RESPIRATORY_TRACT | Status: DC | PRN

## 2018-02-22 MED ORDER — FENTANYL 2.5 MCG/ML W/ROPIVACAINE 0.15% IN NS 100 ML EPIDURAL (ARMC): 12.0000 mL/h | EPIDURAL | Status: DC

## 2018-02-22 MED ORDER — BUTORPHANOL TARTRATE 1 MG/ML IJ SOLN: 1.0000 mg | INTRAMUSCULAR | Status: DC | PRN

## 2018-02-22 MED ORDER — LACTATED RINGERS IV SOLN
INTRAVENOUS | Status: DC
  Administered 2018-02-22: 1000 mL via INTRAVENOUS

## 2018-02-22 MED ORDER — LIDOCAINE HCL (PF) 1 % IJ SOLN: 30.0000 mL | INTRAMUSCULAR | Status: DC | PRN

## 2018-02-22 MED ORDER — EPHEDRINE 5 MG/ML INJ
10.0000 mg | INTRAVENOUS | Status: DC | PRN
  Filled 2018-02-22: qty 2

## 2018-02-22 MED ORDER — MISOPROSTOL 200 MCG PO TABS: 800.0000 ug | ORAL_TABLET | Freq: Once | ORAL | Status: DC | PRN

## 2018-02-22 MED ORDER — PHENYLEPHRINE 40 MCG/ML (10ML) SYRINGE FOR IV PUSH (FOR BLOOD PRESSURE SUPPORT)
80.0000 ug | PREFILLED_SYRINGE | INTRAVENOUS | Status: DC | PRN
  Filled 2018-02-22: qty 5

## 2018-02-22 MED ORDER — SODIUM CHLORIDE 0.9 % IV SOLN
INTRAVENOUS | Status: DC | PRN
  Administered 2018-02-22 (×3): 5 mL via EPIDURAL

## 2018-02-22 MED ORDER — FENTANYL 2.5 MCG/ML W/ROPIVACAINE 0.15% IN NS 100 ML EPIDURAL (ARMC)
EPIDURAL | Status: DC | PRN
  Administered 2018-02-22: 12 mL/h via EPIDURAL

## 2018-02-22 MED ORDER — LIDOCAINE HCL (PF) 1 % IJ SOLN
INTRAMUSCULAR | Status: AC
  Filled 2018-02-22: qty 30

## 2018-02-22 MED ORDER — LACTATED RINGERS IV SOLN: 500.0000 mL | INTRAVENOUS | Status: DC | PRN

## 2018-02-22 MED ORDER — OXYTOCIN 40 UNITS IN LACTATED RINGERS INFUSION - SIMPLE MED
2.5000 [IU]/h | INTRAVENOUS | Status: DC
  Administered 2018-02-23: 2.5 [IU]/h via INTRAVENOUS

## 2018-02-22 MED ORDER — OXYTOCIN 40 UNITS IN LACTATED RINGERS INFUSION - SIMPLE MED: 2.5000 [IU]/h | INTRAVENOUS | Status: DC

## 2018-02-22 MED ORDER — FENTANYL 2.5 MCG/ML W/ROPIVACAINE 0.15% IN NS 100 ML EPIDURAL (ARMC)
EPIDURAL | Status: AC
  Filled 2018-02-22: qty 100

## 2018-02-22 MED ORDER — LACTATED RINGERS IV SOLN
INTRAVENOUS | Status: DC
  Administered 2018-02-22: 20:00:00 via INTRAVENOUS

## 2018-02-22 MED ORDER — OXYTOCIN 40 UNITS IN LACTATED RINGERS INFUSION - SIMPLE MED: 1.0000 m[IU]/min | INTRAVENOUS | Status: DC

## 2018-02-22 MED ORDER — FENTANYL 2.5 MCG/ML BUPIVACAINE 1/10 % EPIDURAL INFUSION (WH - ANES): 14.0000 mL/h | INTRAMUSCULAR | Status: DC | PRN

## 2018-02-22 MED ORDER — SOD CITRATE-CITRIC ACID 500-334 MG/5ML PO SOLN: 30.0000 mL | ORAL | Status: DC | PRN

## 2018-02-22 MED ORDER — ONDANSETRON HCL 4 MG/2ML IJ SOLN: 4.0000 mg | Freq: Four times a day (QID) | INTRAMUSCULAR | Status: DC | PRN

## 2018-02-22 MED ORDER — LIDOCAINE HCL (PF) 1 % IJ SOLN
INTRAMUSCULAR | Status: DC | PRN
  Administered 2018-02-22: 2 mL

## 2018-02-22 MED ORDER — OXYTOCIN 10 UNIT/ML IJ SOLN
INTRAMUSCULAR | Status: AC
  Filled 2018-02-22: qty 2

## 2018-02-22 MED ORDER — LACTATED RINGERS IV SOLN: 500.0000 mL | Freq: Once | INTRAVENOUS | Status: DC

## 2018-02-22 MED ORDER — ACETAMINOPHEN 325 MG PO TABS: 650.0000 mg | ORAL_TABLET | ORAL | Status: DC | PRN

## 2018-02-22 MED ORDER — AMMONIA AROMATIC IN INHA
RESPIRATORY_TRACT | Status: AC
  Filled 2018-02-22: qty 10

## 2018-02-22 NOTE — OB Triage Note (Signed)
Patient reported to L&D with complaints of contractions every 20 minutes. Denies leaking of fluid, vaginal bleeding or decreased fetal movement.

## 2018-02-22 NOTE — Discharge Instructions (Signed)
Return to labor and delivery for strong contractions every 5 minutes or less, if your bag of water breaks, if your baby is not moving well or if there is bleeding like a period.

## 2018-02-22 NOTE — Anesthesia Preprocedure Evaluation (Signed)
Anesthesia Evaluation  Patient identified by MRN, date of birth, ID band Patient awake    Reviewed: Allergy & Precautions, H&P , NPO status , Patient's Chart, lab work & pertinent test results  Airway Mallampati: III       Dental   Pulmonary neg pulmonary ROS,           Cardiovascular Exercise Tolerance: Good negative cardio ROS       Neuro/Psych    GI/Hepatic negative GI ROS,   Endo/Other    Renal/GU   negative genitourinary   Musculoskeletal   Abdominal   Peds  Hematology negative hematology ROS (+)   Anesthesia Other Findings History reviewed. No pertinent past medical history.  History reviewed. No pertinent surgical history.  BMI    Body Mass Index:  37.63 kg/m      Reproductive/Obstetrics (+) Pregnancy                             Anesthesia Physical Anesthesia Plan  ASA: II  Anesthesia Plan: Epidural   Post-op Pain Management:    Induction:   PONV Risk Score and Plan:   Airway Management Planned:   Additional Equipment:   Intra-op Plan:   Post-operative Plan:   Informed Consent: I have reviewed the patients History and Physical, chart, labs and discussed the procedure including the risks, benefits and alternatives for the proposed anesthesia with the patient or authorized representative who has indicated his/her understanding and acceptance.     Plan Discussed with: Anesthesiologist  Anesthesia Plan Comments:         Anesthesia Quick Evaluation

## 2018-02-22 NOTE — Final Progress Note (Signed)
Physician Final Progress Note  Patient ID: Judith Beard MRN: [id number] DOB/AGE: 1995/09/16 22 y.o.  Admit date: 02/22/2018 Admitting provider: Conard Novak, MD Discharge date: 02/22/2018   Admission Diagnoses: IUP at 23wk6d with contractions  Discharge Diagnoses:  IUP at [facility] with false labor  Consults: None  Significant Findings/ Diagnostic Studies: 22 year old G2 P1001 with EDC=03/02/2018 was admitted at 38weeks 6 days after she presented with contractions and her cervix progressed from 3.5 cm to 4.5cm. After admission her contractions spaced out and she made no further progress over the next 3 hours. FHR strip was Cat 1. She was discharged home with labor precautions.   Procedures: none  Discharge Condition: stable  Disposition: Discharge disposition: 01-Home or Self Care       Diet: Regular diet  Discharge Activity: Activity as tolerated   Allergies as of 02/22/2018      Reactions   Penicillins Shortness Of Breath, Swelling   Latex Swelling      Medication List    TAKE these medications   CITRANATAL BLOOM 90-1 MG Tabs Take 1 tablet by mouth daily.        Total time spent taking care of this patient: 30 minutes  Signed: Farrel Conners 02/22/2018, 11:40 AM

## 2018-02-22 NOTE — Progress Notes (Signed)
   Subjective:  Comfortable with epidural in place  Objective:   Vitals: Blood pressure 134/75, pulse 97, temperature 98.5 F (36.9 C), temperature source Oral, resp. rate 16, height 5' 0.98" (1.549 m), weight 90.3 kg, last menstrual period 05/26/2017. General: NAD Abdomen: gravid, non-tender Cervical Exam:  Dilation: 10 Dilation Complete Date: 02/22/18 Dilation Complete Time: 2120 Effacement (%): 100 Cervical Position: Middle Station: -2 Presentation: Vertex Exam by:: Delice Lesch, MD  FHT: 145, moderate, +accels, no decels Toco: q32min  Results for orders placed or performed during the hospital encounter of 02/22/18 (from the past 24 hour(s))  CBC     Status: Abnormal   Collection Time: 02/22/18  9:40 PM  Result Value Ref Range   WBC 10.6 3.6 - 11.0 K/uL   RBC 3.51 (L) 3.80 - 5.20 MIL/uL   Hemoglobin 11.2 (L) 12.0 - 16.0 g/dL   HCT 87.8 (L) 67.6 - 72.0 %   MCV 93.6 80.0 - 100.0 fL   MCH 31.8 26.0 - 34.0 pg   MCHC 34.0 32.0 - 36.0 g/dL   RDW 94.7 (H) 09.6 - 28.3 %   Platelets 294 150 - 440 K/uL  Type and screen     Status: None (Preliminary result)   Collection Time: 02/22/18  9:40 PM  Result Value Ref Range   ABO/RH(D) PENDING    Antibody Screen PENDING    Sample Expiration      02/25/2018 Performed at [facility], [address] [place], [place] [address]     Assessment:   22 y.o. G2P1001 [redacted]w[redacted]d term labor  Plan:   1) Labor - small rim of cervix on right.  Still infrequent contractions.  AROM with minimal return of fluid.  Pitocin as needed should contraction patter fail to pick up  2) Fetus - cat I  Vena Austria, MD, [facility], [facility] 02/22/2018, 10:24 PM

## 2018-02-22 NOTE — H&P (Addendum)
Date of Initial H&P: 01/22/18 (awaiting charge merge MRN [id number])  History reviewed, patient examined, no change in status, admit for term labor  History of Present Illness:  Chief Complaint:  Complains of contractions since 0100 this AM. Contractions becoming stronger. HPI:  Judith Beard is a 22 y.o. G108P1001 female with EDC=03/02/2018 at [redacted]w[redacted]d dated by LMP and confirmed with a 6wk5d ultrasound.  Her pregnancy has been complicated by obesity (BMI>30), and an elevated 1 hour GTT with a normal 3 hr GTT at 28 weeks,. Has gained 20# with her pregnancy and current BMI is 37.6 kg/m2.  She presents to L&D for evaluation of labor. She denies vaginal bleeding and leakage of fluid. Baby active Prenatal care site: Prenatal care at [facility] has been remarkable for   [facility] Prenatal Labs  Dating L=6 Blood type: A/Positive/-- (01/24 1119)   Genetic Screen 1 Screen: neg   AFP:  neg   Quad:     NIPS: Antibody:Negative (01/24 1119)  Anatomic [place]  Rubella: 1.51 (01/24 1119) Varicella: Non-Immune  GTT Early: 126               Third trimester: 171 3hr GTT: 91/154/130/122 RPR: Non Reactive (01/24 1119)   Rhogam N/A HBsAg: Negative (01/24 1119)   TDaP vaccine  12/20/17                      Flu Shot: HIV: Non Reactive (01/24 1119)   Baby Food   Bottle                             GBS: negative  Contraception Paraguard Pap: 07/04/17, NILM  CBB     CS/VBAC    Support Person          Pelvis proven to 7#10 oz  Maternal Medical History:       Past Medical History:  Diagnosis Date  . Anemia   . Obesity (BMI 30-39.9)          Past Surgical History:  Procedure Laterality Date  . NO PAST SURGERIES          Allergies  Allergen Reactions  . Penicillins Shortness Of Breath and Swelling  . Latex Swelling           Prior to Admission medications   Medication Sig Start Date End Date Taking? Authorizing Provider  Prenatal-DSS-FeCb-FeGl-FA (CITRANATAL  BLOOM) 90-1 MG TABS Take 1 tablet by mouth daily. Patient not taking: Reported on 02/22/2018 12/13/17   Oswaldo Conroy, CNM          Social History: She  reports that she has never smoked. She has never used smokeless tobacco. She reports that she does not drink alcohol or use drugs.  Family History: family history includes Hypertension in her maternal grandfather.   Review of Systems: Negative x 10 systems reviewed except as noted in the HPI.      Physical Exam:  Vital Signs: BP (!) 118/57 (BP Location: Left Arm)   Pulse 87   Temp 98.2 F (36.8 C) (Oral)   Resp 18   Ht 5\' 1"  (1.549 m)   Wt 90.3 kg   LMP 05/26/2017 (Exact Date)   BMI 37.60 kg/m  General: no acute distress.  HEENT: normocephalic, atraumatic Heart: regular rate & rhythm.  No murmurs/rubs/gallops Lungs: clear to auscultation bilaterally Abdomen: soft, gravid, non-tender;  EFW: 7 1/2 to 8# Pelvic:  External: Normal external female genitalia             Cervix: Dilation: 4.5 / Effacement (%): 80 / Station: -2/ mid/ soft . Progressed from 3.5 and posterior             Extremities: non-tender, symmetric, no edema bilaterally.  DTRs: +1  Neurologic: Alert & oriented x 3.   FHR: 130 baseline with accelerations to 180s, moderate variability Toco: contractions every 3-8 minutes apart  Assessment:  Albirtha Beard is a 22 y.o. G2P1001 female at [redacted]w[redacted]d with early labor FWB: Cat 1 tracing  Plan: 1.  Admit to Labor & Delivery -anticipate vaginal delivery 2. CBC, T&S, Clrs, IVF. Cn have regular breakfast 3. GBS negative.   4. Consents obtained. 5. Epidural for pain if desires when more active. 6. A POS/ RI/ VNI- Varivax prior to discharge 7. Bottle 8. Contraception: Paragard   Farrel Conners  02/22/2018 9:00 AM           Cosigned by: Vena Austria, MD at 02/22/2018 11:38 AM

## 2018-02-22 NOTE — OB Triage Note (Signed)
Patient arrived in triage with c/o contractions that started at approx 0100, about every 5-8 mins. Reports good fetal movement. Denies leaking of fluid or vaginal bleeding. EFM applied and assessing.

## 2018-02-22 NOTE — Anesthesia Procedure Notes (Signed)
Epidural Patient location during procedure: OB Start time: 02/22/2018 9:01 PM End time: 02/22/2018 9:21 PM  Staffing Anesthesiologist: Jovita Gamma, MD Performed: anesthesiologist   Preanesthetic Checklist Completed: patient identified, site marked, surgical consent, pre-op evaluation, timeout performed, IV checked, risks and benefits discussed and monitors and equipment checked  Epidural Patient position: sitting Prep: ChloraPrep Patient monitoring: heart rate, continuous pulse ox and blood pressure Approach: midline Location: L4-L5 Injection technique: LOR saline  Needle:  Needle type: Tuohy  Needle gauge: 18 G Needle length: 9 cm and 9 Needle insertion depth: 6 cm Catheter type: closed end flexible Catheter size: 20 Guage Catheter at skin depth: 9 cm Test dose: negative and 1.5% lidocaine with Epi 1:200 K  Assessment Events: blood not aspirated, injection not painful, no injection resistance, negative IV test and no paresthesia  Additional Notes   Patient tolerated the insertion well without complications.Reason for block:procedure for pain

## 2018-02-22 NOTE — H&P (Signed)
OB History & Physical   History of Present Illness:  Chief Complaint:  Complains of contractions since 0100 this AM. Contractions becoming stronger. HPI:  Judith Beard is a 22 y.o. G81P1001 female with EDC=03/02/2018 at [redacted]w[redacted]d dated by LMP and confirmed with a 6wk5d ultrasound.  Her pregnancy has been complicated by obesity (BMI>30), and an elevated 1 hour GTT with a normal 3 hr GTT at 28 weeks,. Has gained 20# with her pregnancy and current BMI is 37.6 kg/m2.  She presents to L&D for evaluation of labor. She denies vaginal bleeding and leakage of fluid. Baby active Prenatal care site: Prenatal care at [facility] has been remarkable for   [facility] Prenatal Labs  Dating L=6 Blood type: A/Positive/-- (01/24 1119)   Genetic Screen 1 Screen: neg   AFP:  neg   Quad:     NIPS: Antibody:Negative (01/24 1119)  Anatomic [place]  Rubella: 1.51 (01/24 1119) Varicella: Non-Immune  GTT Early: 126               Third trimester: 171 3hr GTT: 91/154/130/122 RPR: Non Reactive (01/24 1119)   Rhogam N/A HBsAg: Negative (01/24 1119)   TDaP vaccine  12/20/17                      Flu Shot: HIV: Non Reactive (01/24 1119)   Baby Food   Bottle                             GBS: negative  Contraception Paraguard Pap: 07/04/17, NILM  CBB     CS/VBAC    Support Person          Pelvis proven to 7#10 oz  Maternal Medical History:   Past Medical History:  Diagnosis Date  . Anemia   . Obesity (BMI 30-39.9)     Past Surgical History:  Procedure Laterality Date  . NO PAST SURGERIES      Allergies  Allergen Reactions  . Penicillins Shortness Of Breath and Swelling  . Latex Swelling    Prior to Admission medications   Medication Sig Start Date End Date Taking? Authorizing Provider  Prenatal-DSS-FeCb-FeGl-FA (CITRANATAL BLOOM) 90-1 MG TABS Take 1 tablet by mouth daily. Patient not taking: Reported on 02/22/2018 12/13/17   Oswaldo Conroy, CNM          Social History: She  reports  that she has never smoked. She has never used smokeless tobacco. She reports that she does not drink alcohol or use drugs.  Family History: family history includes Hypertension in her maternal grandfather.   Review of Systems: Negative x 10 systems reviewed except as noted in the HPI.      Physical Exam:  Vital Signs: BP (!) 118/57 (BP Location: Left Arm)   Pulse 87   Temp 98.2 F (36.8 C) (Oral)   Resp 18   Ht 5\' 1"  (1.549 m)   Wt 90.3 kg   LMP 05/26/2017 (Exact Date)   BMI 37.60 kg/m  General: no acute distress.  HEENT: normocephalic, atraumatic Heart: regular rate & rhythm.  No murmurs/rubs/gallops Lungs: clear to auscultation bilaterally Abdomen: soft, gravid, non-tender;  EFW: 7 1/2 to 8# Pelvic:   External: Normal external female genitalia  Cervix: Dilation: 4.5 / Effacement (%): 80 / Station: -2/ mid/ soft . Progressed from 3.5 and posterior  Extremities: non-tender, symmetric, no edema bilaterally.  DTRs: +1  Neurologic: Alert & oriented x 3.  FHR: 130 baseline with accelerations to 180s, moderate variability Toco: contractions every 3-8 minutes apart  Assessment:  Judith Beard is a 22 y.o. G2P1001 female at [redacted]w[redacted]d with early labor FWB: Cat 1 tracing  Plan:  1. Admit to Labor & Delivery -anticipate vaginal delivery 2. CBC, T&S, Clrs, IVF. Cn have regular breakfast 3. GBS negative.   4. Consents obtained. 5. Epidural for pain if desires when more active. 6. A POS/ RI/ VNI- Varivax prior to discharge 7. Bottle 8. Contraception: Paragard   Farrel Conners  02/22/2018 9:00 AM

## 2018-02-23 DIAGNOSIS — Z3A38 38 weeks gestation of pregnancy: Secondary | ICD-10-CM

## 2018-02-23 LAB — CBC
HEMATOCRIT: 31.3 % — AB (ref 35.0–47.0)
Hemoglobin: 10.8 g/dL — ABNORMAL LOW (ref 12.0–16.0)
MCH: 32.2 pg (ref 26.0–34.0)
MCHC: 34.3 g/dL (ref 32.0–36.0)
MCV: 93.7 fL (ref 80.0–100.0)
PLATELETS: 275 10*3/uL (ref 150–440)
RBC: 3.35 MIL/uL — AB (ref 3.80–5.20)
RDW: 14.8 % — ABNORMAL HIGH (ref 11.5–14.5)
WBC: 13.1 10*3/uL — ABNORMAL HIGH (ref 3.6–11.0)

## 2018-02-23 LAB — RPR: RPR Ser Ql: NONREACTIVE

## 2018-02-23 MED ORDER — COCONUT OIL OIL: 1.0000 "application " | TOPICAL_OIL | Status: DC | PRN

## 2018-02-23 MED ORDER — IBUPROFEN 600 MG PO TABS
600.0000 mg | ORAL_TABLET | Freq: Four times a day (QID) | ORAL | Status: DC
  Administered 2018-02-24 (×2): 600 mg via ORAL
  Filled 2018-02-23 (×4): qty 1

## 2018-02-23 MED ORDER — OXYCODONE-ACETAMINOPHEN 5-325 MG PO TABS: 1.0000 | ORAL_TABLET | ORAL | Status: DC | PRN

## 2018-02-23 MED ORDER — SENNOSIDES-DOCUSATE SODIUM 8.6-50 MG PO TABS: 2.0000 | ORAL_TABLET | ORAL | Status: DC

## 2018-02-23 MED ORDER — DIBUCAINE 1 % RE OINT: 1.0000 "application " | TOPICAL_OINTMENT | RECTAL | Status: DC | PRN

## 2018-02-23 MED ORDER — DIPHENHYDRAMINE HCL 25 MG PO CAPS: 25.0000 mg | ORAL_CAPSULE | Freq: Four times a day (QID) | ORAL | Status: DC | PRN

## 2018-02-23 MED ORDER — TETANUS-DIPHTH-ACELL PERTUSSIS 5-2.5-18.5 LF-MCG/0.5 IM SUSP: 0.5000 mL | Freq: Once | INTRAMUSCULAR | Status: DC

## 2018-02-23 MED ORDER — ACETAMINOPHEN 325 MG PO TABS: 650.0000 mg | ORAL_TABLET | ORAL | Status: DC | PRN

## 2018-02-23 MED ORDER — WITCH HAZEL-GLYCERIN EX PADS: 1.0000 "application " | MEDICATED_PAD | CUTANEOUS | Status: DC | PRN

## 2018-02-23 MED ORDER — PRENATAL MULTIVITAMIN CH
1.0000 | ORAL_TABLET | Freq: Every day | ORAL | Status: DC
  Administered 2018-02-23: 1 via ORAL
  Filled 2018-02-23: qty 1

## 2018-02-23 MED ORDER — SIMETHICONE 80 MG PO CHEW: 80.0000 mg | CHEWABLE_TABLET | ORAL | Status: DC | PRN

## 2018-02-23 MED ORDER — OXYCODONE-ACETAMINOPHEN 5-325 MG PO TABS: 2.0000 | ORAL_TABLET | ORAL | Status: DC | PRN

## 2018-02-23 MED ORDER — ONDANSETRON HCL 4 MG/2ML IJ SOLN: 4.0000 mg | INTRAMUSCULAR | Status: DC | PRN

## 2018-02-23 MED ORDER — BENZOCAINE-MENTHOL 20-0.5 % EX AERO: 1.0000 "application " | INHALATION_SPRAY | CUTANEOUS | Status: DC | PRN

## 2018-02-23 MED ORDER — ONDANSETRON HCL 4 MG PO TABS: 4.0000 mg | ORAL_TABLET | ORAL | Status: DC | PRN

## 2018-02-23 NOTE — Plan of Care (Signed)
Oriented to MB unit

## 2018-02-23 NOTE — Discharge Summary (Signed)
Obstetric Discharge Summary Reason for Admission: onset of labor Prenatal Procedures: none Intrapartum Procedures: spontaneous vaginal delivery Postpartum Procedures: varicella vaccine Complications-Operative and Postpartum: none Hemoglobin  Date Value Ref Range Status  02/23/2018 10.8 (L) 12.0 - 16.0 g/dL Final   HCT  Date Value Ref Range Status  02/23/2018 31.3 (L) 35.0 - 47.0 % Final    Physical Exam:  General: alert, cooperative and no distress Lochia: appropriate Uterine Fundus: firm Incision: n/a  DVT Evaluation: No evidence of DVT seen on physical exam.  Discharge Diagnoses: Term Pregnancy-delivered  Discharge Information: Date: 02/24/2018 Activity: pelvic rest Diet: routine Allergies as of 02/24/2018      Reactions   Penicillins Anaphylaxis   Latex Swelling      Medication List    TAKE these medications   ibuprofen 600 MG tablet Commonly known as:  ADVIL,MOTRIN Take 1 tablet (600 mg total) by mouth every 6 (six) hours.            Discharge Care Instructions  (From admission, onward)         Start     Ordered   02/24/18 0000  Discharge wound care:    Comments:  Perform wound care instructions   02/24/18 1040          Condition: stable Discharge to: home Follow-up Information    Vena Austria, MD Follow up in 6 week(s).   Specialty:  Obstetrics and Gynecology Why:  6 week postparum and Paraguard placement Contact information: [address] [place] [address] [phone]           Newborn Data: Live born female  Birth Weight:   APGAR: ,   Newborn Delivery   Birth date/time:  02/23/2018 00:03:00 Delivery type:  Vaginal, Spontaneous     Home with mother.  Thomasene Mohair, MD 02/24/2018, 10:41 AM

## 2018-02-23 NOTE — Progress Notes (Signed)
Post Partum Day 0  9 hours postpartum Subjective: no complaints, voiding and tolerating PO. Baby bottle feeding well.  Objective: Blood pressure 113/68, pulse 79, temperature 98.2 F (36.8 C), temperature source Oral, resp. rate 20, height 5' 0.98" (1.549 m), weight 90.3 kg, last menstrual period 05/26/2017, SpO2 97 %, unknown if currently breastfeeding.  Physical Exam:  General: alert, cooperative and no distress , resting Lochia: appropriate Uterine Fundus: firm, slightly left of ML/ NT Incision: NA DVT Evaluation: No evidence of DVT seen on physical exam.  Recent Labs    02/22/18 2140 02/23/18 0534  HGB 11.2* 10.8*  HCT 32.8* 31.3*  WBC 10.6 13.1*  PLT 294 275    Assessment/Plan: Stable DOD-continue postpartum care Mild chronic anemia-prenatal vitamins with iron A POS/ RI/ VNI-offer Varivax prior to discharge Bottle Contraception: Paragard TDAP-given 12/20/2017 Possible discharge tomorrow   LOS: 1 day   Farrel Conners 02/23/2018, 9:34 AM

## 2018-02-24 LAB — RPR: RPR: NONREACTIVE

## 2018-02-24 MED ORDER — IBUPROFEN 600 MG PO TABS: 600.0000 mg | ORAL_TABLET | Freq: Four times a day (QID) | ORAL | 0 refills | Status: DC

## 2018-02-24 MED ORDER — VARICELLA VIRUS VACCINE LIVE 1350 PFU/0.5ML IJ SUSR
0.5000 mL | Freq: Once | INTRAMUSCULAR | Status: AC
  Administered 2018-02-24: 0.5 mL via SUBCUTANEOUS
  Filled 2018-02-24 (×2): qty 0.5

## 2018-02-24 NOTE — Progress Notes (Signed)
Reviewed D/C instructions with pt and family. Pt verbalized understanding of teaching. Discharged to home via W/C. Pt to schedule f/u appt.  

## 2018-02-24 NOTE — Anesthesia Postprocedure Evaluation (Signed)
Anesthesia Post Note  Patient: Judith Beard  Procedure(s) Performed: AN AD HOC LABOR EPIDURAL  Patient location during evaluation: Mother Baby Anesthesia Type: Epidural Level of consciousness: awake and alert Pain management: pain level controlled Vital Signs Assessment: post-procedure vital signs reviewed and stable Respiratory status: spontaneous breathing, nonlabored ventilation and respiratory function stable Cardiovascular status: stable Postop Assessment: no headache, no backache and epidural receding Anesthetic complications: no     Last Vitals:  Vitals:   02/24/18 0023 02/24/18 0715  BP: 116/65 113/74  Pulse: 66 74  Resp: 16 20  Temp: 36.8 C 36.5 C  SpO2: 99% 100%    Last Pain:  Vitals:   02/24/18 0715  TempSrc: Oral  PainSc:                  [name]

## 2018-02-28 ENCOUNTER — Encounter: Payer: Medicaid Other | Admitting: Maternal Newborn

## 2018-03-02 ENCOUNTER — Inpatient Hospital Stay: Admit: 2018-03-02 | Payer: Self-pay

## 2018-03-07 ENCOUNTER — Encounter (HOSPITAL_COMMUNITY): Payer: Self-pay

## 2018-03-07 ENCOUNTER — Telehealth: Payer: Self-pay | Admitting: Obstetrics and Gynecology

## 2018-03-07 NOTE — Telephone Encounter (Signed)
Patient scheduled 10/16 for 6 wk PP with AMS, states she is getting copper IUD at that visit.

## 2018-03-08 NOTE — Telephone Encounter (Signed)
Noted. Will order to arrive by apt date/time. 

## 2018-03-14 NOTE — Telephone Encounter (Signed)
Patient schedule to afternoon appt at 2;30 same day

## 2018-04-04 ENCOUNTER — Ambulatory Visit (INDEPENDENT_AMBULATORY_CARE_PROVIDER_SITE_OTHER): Payer: Medicaid Other | Admitting: Obstetrics and Gynecology

## 2018-04-04 ENCOUNTER — Encounter: Payer: Self-pay | Admitting: Obstetrics and Gynecology

## 2018-04-04 ENCOUNTER — Ambulatory Visit: Payer: Medicaid Other | Admitting: Obstetrics and Gynecology

## 2018-04-04 DIAGNOSIS — Z30430 Encounter for insertion of intrauterine contraceptive device: Secondary | ICD-10-CM | POA: Diagnosis not present

## 2018-04-04 NOTE — Progress Notes (Signed)
Postpartum Visit  Chief Complaint:  Chief Complaint  Patient presents with  . Postpartum Care    Vaginal Delivery 9/6  . Contraception    Paragard Insertion    History of Present Illness: Patient is a 22 y.o. [id number] presents for postpartum visit.   Review the Delivery Report for details.  Delivery Note At 12:03 AM on 02/23/18 a viable female was delivered via Vaginal,  9, 9; weight 6 lb 13.7 oz (3110 g).    Episiotomy No.  Laceration: no  Pregnancy or labor problems:  no Any problems since the delivery:  no  Newborn Details:  SINGLETON :  Breast or formula feeding: plans to bottle feed Intercourse: No  Contraception after delivery: No  Any bowel or bladder issues: No  Post partum depression/anxiety noted:  no Edinburgh Post-Partum Depression Score:0 Date of last PAP: 07/04/17 no abnormalities   Review of Systems: ROS  The following portions of the patient's history were reviewed and updated as appropriate: allergies, current medications, past family history, past medical history, past social history, past surgical history and problem list.  Past Medical History:  Past Medical History:  Diagnosis Date  . Anemia   . Obesity (BMI 30-39.9)     Past Surgical History:  Past Surgical History:  Procedure Laterality Date  . NO PAST SURGERIES      Family History:  Family History  Problem Relation Age of Onset  . Hypertension Maternal Grandfather     Social History:  Social History   Socioeconomic History  . Marital status: Single    Spouse name: Not on file  . Number of children: 1  . Years of education: 10  . Highest education level: Not on file  Occupational History  . 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 Occupation: unemployed  Social Needs  . Financial resource strain: Not on file  . Food insecurity:    Worry: Not on file    Inability: Not on file  . Transportation needs:    Medical: Not on file    Non-medical: Not on file    Tobacco Use  . Smoking status: Never Smoker  . Smokeless tobacco: Never Used  Substance and Sexual Activity  . Alcohol use: Not Currently    Frequency: Never  . Drug use: Never  . Sexual activity: Not Currently    Birth control/protection: IUD, None    Comment: Copper T  Lifestyle  . Physical activity:    Days per week: Not on file    Minutes per session: Not on file  . Stress: Not on file  Relationships  . Social connections:    Talks on phone: Not on file    Gets together: Not on file    Attends religious service: Not on file    Active member of club or organization: Not on file    Attends meetings of clubs or organizations: Not on file    Relationship status: Not on file  . Intimate partner violence:    Fear of current or ex partner: Not on file    Emotionally abused: Not on file    Physically abused: Not on file    Forced sexual activity: Not on file  Other Topics Concern  . Not on file  Social History Narrative   ** Merged History Encounter **        Allergies:  Allergies  Allergen Reactions  . Penicillins Shortness Of Breath and Swelling  . Penicillins  Anaphylaxis  . Latex Swelling    Medications: Prior to Admission medications   Medication Sig Start Date End Date Taking? Authorizing Provider  ibuprofen (ADVIL,MOTRIN) 600 MG tablet Take 1 tablet (600 mg total) by mouth every 6 (six) hours. 02/24/18   Conard Novak, MD  Prenatal-DSS-FeCb-FeGl-FA (CITRANATAL BLOOM) 90-1 MG TABS Take 1 tablet by mouth daily. Patient not taking: Reported on 02/22/2018 12/13/17   Oswaldo Conroy, CNM    Physical Exam Last menstrual period 05/26/2017, unknown if currently breastfeeding.    General: NAD HEENT: normocephalic, anicteric Pulmonary: No increased work of breathing Abdomen: NABS, soft, non-tender, non-distended.  Umbilicus without lesions.  No hepatomegaly, splenomegaly or masses palpable. No evidence of hernia. Genitourinary:  External: Normal external female  genitalia.  Normal urethral meatus, normal  Bartholin's and Skene's glands.    Vagina: Normal vaginal mucosa, no evidence of prolapse.    Cervix: Grossly normal in appearance, no bleeding  Uterus: Non-enlarged, mobile, normal contour.  No CMT  Adnexa: ovaries non-enlarged, no adnexal masses  Rectal: deferred Extremities: no edema, erythema, or tenderness Neurologic: Grossly intact Psychiatric: mood appropriate, affect full  Edinburgh Postnatal Depression Scale - 04/04/18 1448      Edinburgh Postnatal Depression Scale:  In the Past 7 Days   I have been able to laugh and see the funny side of things.  0    I have looked forward with enjoyment to things.  0    I have blamed myself unnecessarily when things went wrong.  0    I have been anxious or worried for no good reason.  0    I have felt scared or panicky for no good reason.  0    Things have been getting on top of me.  0    I have been so unhappy that I have had difficulty sleeping.  0    I have felt sad or miserable.  0    I have been so unhappy that I have been crying.  0    The thought of harming myself has occurred to me.  0    Judith Beard Postnatal Depression Scale Total  0      GYNECOLOGY OFFICE PROCEDURE NOTE  Judith Beard is a 22 y.o. [phone] here for Paragard  IUD insertion. No GYN concerns.  The patient is currently using abstinence for contraception and her LMP is Patient's last menstrual period was 03/28/2018 (exact date)..  The indication for her IUD is contraception/cycle control.  IUD Insertion Procedure Note Patient identified, informed consent performed, consent signed.   Discussed risks of irregular bleeding, cramping, infection, malpositioning, expulsion or uterine perforation of the IUD (1:1000 placements)  which may require further procedure such as laparoscopy.  IUD while effective at preventing pregnancy do not prevent transmission of sexually transmitted diseases and use of barrier methods for this  purpose was discussed. Time out was performed.  Urine pregnancy test negative.  Speculum placed in the vagina.  Cervix visualized.  Cleaned with Betadine x 2.  Grasped anteriorly with a single tooth tenaculum.  Uterus sounded to 9 cm. IUD placed per manufacturer's recommendations.  Strings trimmed to 3 cm. Tenaculum was removed, good hemostasis noted.  Patient tolerated procedure well.   Patient was given post-procedure instructions.  She was advised to have backup contraception for one week.  Patient was also asked to check IUD strings periodically and follow up in 6 weeks for IUD check.  Assessment: 22 y.o. A5W0981 presenting for 6 week postpartum visit  Plan: Problem List Items Addressed This Visit    None    Visit Diagnoses    6 weeks postpartum follow-up    -  Primary   Encounter for IUD insertion           1) Contraception - Education given regarding options for contraception, as well as compatibility with breast feeding if applicable.  Patient plans on IUD for contraception.  2)  Pap - ASCCP guidelines and rational discussed.  ASCCP guidelines and rational discussed.  Patient opts for every 3 years screening interval  3) Patient underwent screening for postpartum depression with no signs of depression  4) Return in about 6 weeks (around 05/16/2018) for IUD string check.   Judith Austria, MD, [facility], [facility] 04/04/2018, 3:01 PM

## 2018-05-15 ENCOUNTER — Encounter: Payer: Self-pay | Admitting: Obstetrics and Gynecology

## 2018-05-15 ENCOUNTER — Ambulatory Visit (INDEPENDENT_AMBULATORY_CARE_PROVIDER_SITE_OTHER): Payer: Medicaid Other | Admitting: Obstetrics and Gynecology

## 2018-05-15 VITALS — BP 126/80 | HR 83 | Wt 186.0 lb

## 2018-05-15 DIAGNOSIS — Z30431 Encounter for routine checking of intrauterine contraceptive device: Secondary | ICD-10-CM | POA: Diagnosis not present

## 2018-05-16 NOTE — Progress Notes (Signed)
Obstetrics & Gynecology Office Visit   Chief Complaint:  Chief Complaint  Patient presents with  . Follow-up    IUD check    History of Present Illness: 22 y.o. patient presenting for follow up of Mirena IUD placement 6 weeks ago.  The indication for her IUD was contraception.  She denies any complications since her IUD placement.  Still having some occasional spotting. Has not checked  to feel strings.    Review of Systems: Review of Systems  Constitutional: Negative.   Gastrointestinal: Negative.   Genitourinary: Negative.     Past Medical History:  Past Medical History:  Diagnosis Date  . Anemia   . Obesity (BMI 30-39.9)     Past Surgical History:  Past Surgical History:  Procedure Laterality Date  . Mirena    . NO PAST SURGERIES      Gynecologic History: Patient's last menstrual period was 05/03/2018.  Obstetric History: [id number]  Family History:  Family History  Problem Relation Age of Onset  . Hypertension Maternal Grandfather     Social History:  Social History   Socioeconomic History  . Marital status: Single    Spouse name: Not on file  . Number of children: 1  . Years of education: 2412  . Highest education level: Not on file  Occupational History  . Occupation: Conservation officer, naturecashier    Comment: at a [place] [place]  . Occupation: unemployed  Social Needs  . Financial resource strain: Not on file  . Food insecurity:    Worry: Not on file    Inability: Not on file  . Transportation needs:    Medical: Not on file    Non-medical: Not on file  Tobacco Use  . Smoking status: Never Smoker  . Smokeless tobacco: Never Used  Substance and Sexual Activity  . Alcohol use: Not Currently    Frequency: Never  . Drug use: Never  . Sexual activity: Not Currently    Birth control/protection: IUD, None    Comment: Copper T  Lifestyle  . Physical activity:    Days per week: Not on file    Minutes per session: Not on file  . Stress: Not on file  Relationships    . Social connections:    Talks on phone: Not on file    Gets together: Not on file    Attends religious service: Not on file    Active member of club or organization: Not on file    Attends meetings of clubs or organizations: Not on file    Relationship status: Not on file  . Intimate partner violence:    Fear of current or ex partner: Not on file    Emotionally abused: Not on file    Physically abused: Not on file    Forced sexual activity: Not on file  Other Topics Concern  . Not on file  Social History Narrative   ** Merged History Encounter **        Allergies:  Allergies  Allergen Reactions  . Penicillins Shortness Of Breath and Swelling  . Penicillins Anaphylaxis  . Latex Swelling    Medications: Prior to Admission medications   Medication Sig Start Date End Date Taking? Authorizing Provider  levonorgestrel (MIRENA) 20 MCG/24HR IUD 1 each by Intrauterine route once.   Yes [provider]    Physical Exam Blood pressure 126/80, pulse 83, weight 186 lb (84.4 kg), last menstrual period 05/03/2018, not currently breastfeeding. Patient's last menstrual period was 05/03/2018.  General: NAD HEENT: normocephalic, anicteric Pulmonary: No increased work of breathing  Genitourinary:  External: Normal external female genitalia.  Normal urethral meatus, normal  Bartholin's and Skene's glands.    Vagina: Normal vaginal mucosa, no evidence of prolapse.    Cervix: Grossly normal in appearance, no bleeding, IUD strings visualized 2cm  Uterus: Non-enlarged, mobile, normal contour.  No CMT  Adnexa: ovaries non-enlarged, no adnexal masses  Rectal: deferred  Lymphatic: no evidence of inguinal lymphadenopathy Extremities: no edema, erythema, or tenderness Neurologic: Grossly intact Psychiatric: mood appropriate, affect full  Female chaperone present for pelvic and breast  portions of the physical exam  Assessment: 22 y.o. [id number] IUD check up Plan: Problem List  Items Addressed This Visit    None    Visit Diagnoses    IUD check up    -  Primary       1.  The patient was given instructions to check her IUD strings monthly and call with any problems or concerns.  She should call for fevers, chills, abnormal vaginal discharge, pelvic pain, or other complaints.  2.   IUDs while effective at preventing pregnancy do not prevent transmission of sexually transmitted diseases and use of barrier methods for this purpose was discussed.  Low overall incidence of failure with 99.7% efficacy rate in typical use.  The patient has not contraindication to IUD placement.  3.  She will return for a annual exam in 1 year.  All questions answered.  4) A total of 15 minutes were spent in face-to-face contact with the patient during this encounter with over half of that time devoted to counseling and coordination of care.  5) Return in about 1 year (around 05/16/2019) for annual.   Vena Austria, MD, [facility], [facility]

## 2019-08-06 NOTE — Telephone Encounter (Signed)
Paragard rcvd/charged 04/04/18

## 2020-04-27 ENCOUNTER — Other Ambulatory Visit (HOSPITAL_COMMUNITY)
Admission: RE | Admit: 2020-04-27 | Discharge: 2020-04-27 | Disposition: A | Discharge status: Home / Self Care | Payer: Medicaid Other | Source: Ambulatory Visit | Attending: Obstetrics and Gynecology | Admitting: Obstetrics and Gynecology

## 2020-04-27 ENCOUNTER — Encounter: Payer: Self-pay | Admitting: Obstetrics and Gynecology

## 2020-04-27 ENCOUNTER — Ambulatory Visit (INDEPENDENT_AMBULATORY_CARE_PROVIDER_SITE_OTHER): Payer: Medicaid Other | Admitting: Obstetrics and Gynecology

## 2020-04-27 ENCOUNTER — Other Ambulatory Visit: Payer: Self-pay

## 2020-04-27 VITALS — BP 130/70 | Ht 61.0 in | Wt 195.0 lb

## 2020-04-27 DIAGNOSIS — Z124 Encounter for screening for malignant neoplasm of cervix: Secondary | ICD-10-CM

## 2020-04-27 DIAGNOSIS — Z30432 Encounter for removal of intrauterine contraceptive device: Secondary | ICD-10-CM

## 2020-04-27 DIAGNOSIS — N930 Postcoital and contact bleeding: Secondary | ICD-10-CM

## 2020-04-27 DIAGNOSIS — Z113 Encounter for screening for infections with a predominantly sexual mode of transmission: Secondary | ICD-10-CM | POA: Insufficient documentation

## 2020-04-27 NOTE — Progress Notes (Signed)
Patient, No Pcp Per   Chief Complaint  Patient presents with  . IUD removal    cramping and bleeding after intercourse x 1 month, not interested in new [facility]    HPI:      Ms. Judith Beard is a 24 y.o. [phone] whose LMP was Patient's last menstrual period was 03/31/2020 (approximate)., presents today for bleeding and pain with sex the past month. Paragard placed 04/04/18 (chart says Mirena but definitely Paragard on exam). Has small amount of blood with wiping after sex and some the next day. Mild cramping during sex. Sx unusual for pt. Has monthly menses with IUD, lasting 9 days, heavy flow, no BTB, no dysmen. She denies any new partners. Neg pap 1/19; neg STD testing 8/19. No vag or urin sx.  Would like IUD removed for conception. Not taking PNVs.    Past Medical History:  Diagnosis Date  . Anemia   . Obesity (BMI 30-39.9)     Past Surgical History:  Procedure Laterality Date  . Mirena    . NO PAST SURGERIES      Family History  Problem Relation Age of Onset  . Hypertension Maternal Grandfather     Social History   Socioeconomic History  . Marital status: Single    Spouse name: Not on file  . Number of children: 1  . Years of education: 55  . Highest education level: Not on file  Occupational History  . 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 Occupation: unemployed  Tobacco Use  . Smoking status: Never Smoker  . Smokeless tobacco: Never Used  Vaping Use  . Vaping Use: Never used  Substance and Sexual Activity  . Alcohol use: Not Currently  . Drug use: Never  . Sexual activity: Yes    Birth control/protection: I.U.D.    Comment: Mirena  Other Topics Concern  . Not on file  Social History Narrative   ** Merged History Encounter **       Social Determinants of Health   Financial Resource Strain:   . Difficulty of Paying Living Expenses: Not on file  Food Insecurity:   . Worried About Programme researcher, broadcasting/film/video in the Last Year: Not on  file  . Ran Out of Food in the Last Year: Not on file  Transportation Needs:   . Lack of Transportation (Medical): Not on file  . Lack of Transportation (Non-Medical): Not on file  Physical Activity:   . Days of Exercise per Week: Not on file  . Minutes of Exercise per Session: Not on file  Stress:   . Feeling of Stress : Not on file  Social Connections:   . Frequency of Communication with Friends and Family: Not on file  . Frequency of Social Gatherings with Friends and Family: Not on file  . Attends Religious Services: Not on file  . Active Member of Clubs or Organizations: Not on file  . Attends Banker Meetings: Not on file  . Marital Status: Not on file  Intimate Partner Violence:   . Fear of Current or Ex-Partner: Not on file  . Emotionally Abused: Not on file  . Physically Abused: Not on file  . Sexually Abused: Not on file    Outpatient Medications Prior to Visit  Medication Sig Dispense Refill  . levonorgestrel (MIRENA) 20 MCG/24HR IUD 1 each by Intrauterine route once.     No facility-administered medications prior to visit.  ROS:  Review of Systems  Constitutional: Negative for fever.  Gastrointestinal: Negative for blood in stool, constipation, diarrhea, nausea and vomiting.  Genitourinary: Positive for dyspareunia and vaginal bleeding. Negative for dysuria, flank pain, frequency, hematuria, urgency, vaginal discharge and vaginal pain.  Musculoskeletal: Negative for back pain.  Skin: Negative for rash.   BREAST: No symptoms   OBJECTIVE:   Vitals:  BP 130/70   Ht 5\' 1"  (1.549 m)   Wt 195 lb (88.5 kg)   LMP 03/31/2020 (Approximate)   Breastfeeding No   BMI 36.84 kg/m   Physical Exam Vitals reviewed.  Constitutional:      Appearance: She is well-developed.  Pulmonary:     Effort: Pulmonary effort is normal.  Genitourinary:    General: Normal vulva.     Pubic Area: No rash.      Labia:        Right: No rash, tenderness or  lesion.        Left: No rash, tenderness or lesion.      Vagina: Normal. No vaginal discharge, erythema or tenderness.     Cervix: Normal.     Uterus: Normal. Not enlarged and not tender.      Adnexa: Right adnexa normal and left adnexa normal.       Right: No mass or tenderness.         Left: No mass or tenderness.       Comments: IUD STRINGS IN CX OS Musculoskeletal:        General: Normal range of motion.     Cervical back: Normal range of motion.  Skin:    General: Skin is warm and dry.  Neurological:     General: No focal deficit present.     Mental Status: She is alert and oriented to person, place, and time.  Psychiatric:        Mood and Affect: Mood normal.        Behavior: Behavior normal.        Thought Content: Thought content normal.        Judgment: Judgment normal.    IUD Removal Strings of IUD identified and grasped.  IUD removed without problem with ring forceps.  Pt tolerated this well.  IUD noted to be intact.   Assessment/Plan: Postcoital bleeding - Plan: Cytology - PAP; with dyspareunia. IUD strings in cx os, removed today per pt pref. Check pap/STDs. If sx persist, will treat with doxy. If pain cont, will check GYN u/s. F/u prn.   Cervical cancer screening - Plan: Cytology - PAP  Screening for STD (sexually transmitted disease) - Plan: Cytology - PAP  Encounter for IUD removal-pt tolerated well. IUD definitely Paragard on removal  START PNVs   Return if symptoms worsen or fail to improve.  Charon Akamine B. Katira Dumais, PA-C 04/27/2020 3:19 PM

## 2020-04-27 NOTE — Patient Instructions (Signed)
I value your feedback and entrusting us with your care. If you get a [facility] patient survey, I would appreciate you taking the time to let us know about your experience today. Thank you!  As of May 30, 2019, your lab results will be released to your MyChart immediately, before I even have a chance to see them. Please give me time to review them and contact you if there are any abnormalities. Thank you for your patience.  

## 2020-05-04 LAB — CYTOLOGY - PAP
Chlamydia: NEGATIVE
Comment: NEGATIVE
Comment: NORMAL
Diagnosis: NEGATIVE
Neisseria Gonorrhea: NEGATIVE

## 2020-06-20 NOTE — L&D Delivery Note (Signed)
Delivery Note Primary OB: [facility] Delivery Physician: Annamarie Major, MD Gestational Age: Full term Antepartum complications: none Intrapartum complications: None  A viable Female was delivered via vertex presentation.  Apgars:8 ,9  Weight:  pending.   Placenta status: spontaneous and Intact.  Cord: 3+ vessels;  with the following complications: none.  Anesthesia:  epidural Episiotomy:  none Lacerations:  none Suture Repair: none Est. Blood Loss (mL):  less than 100 mL  Mom to postpartum.  Baby to Couplet care / Skin to Skin.  Annamarie Major, MD, [facility], [facility] 04/25/2021  7:36 AM [phone]

## 2020-08-20 ENCOUNTER — Ambulatory Visit
Admission: RE | Admit: 2020-08-20 | Discharge: 2020-08-20 | Disposition: A | Discharge status: Home / Self Care | Payer: Medicaid Other | Source: Ambulatory Visit | Attending: Obstetrics and Gynecology | Admitting: Obstetrics and Gynecology

## 2020-08-20 ENCOUNTER — Ambulatory Visit (INDEPENDENT_AMBULATORY_CARE_PROVIDER_SITE_OTHER): Payer: Medicaid Other | Admitting: Obstetrics and Gynecology

## 2020-08-20 ENCOUNTER — Other Ambulatory Visit: Payer: Self-pay

## 2020-08-20 ENCOUNTER — Encounter: Payer: Self-pay | Admitting: Obstetrics and Gynecology

## 2020-08-20 ENCOUNTER — Telehealth: Payer: Self-pay

## 2020-08-20 VITALS — BP 128/72 | Ht 61.0 in | Wt 197.2 lb

## 2020-08-20 DIAGNOSIS — Z3490 Encounter for supervision of normal pregnancy, unspecified, unspecified trimester: Secondary | ICD-10-CM | POA: Insufficient documentation

## 2020-08-20 DIAGNOSIS — N926 Irregular menstruation, unspecified: Secondary | ICD-10-CM | POA: Diagnosis present

## 2020-08-20 LAB — POCT URINE PREGNANCY: Preg Test, Ur: POSITIVE — AB

## 2020-08-20 NOTE — Progress Notes (Signed)
Patient ID: Judith Beard, female   DOB: 06/15/1996, 25 y.o.   MRN: [id number]  Reason for Consult: Gynecologic Exam   Referred by No ref. provider found  Subjective:     HPI:  Judith Beard is a 25 y.o. female.  Patient reports that she has been trying to conceive since her IUD was removed.  She has not had an periods since her IUD was removed.  However she reports that she has been taking regular weekly pregnancy test because she felt that she was pregnancy. She reports these pregnancy tests were negative until yesterday.  She reports that her first positive pregnancy test was yesterday.  She is concerned that she could have a tubal pregnancy and would like to make sure that her pregnancy is in the normal location.  She does not have a history of prior ectopic pregnancies.  She also does not report pelvic pain or vaginal bleeding or spotting.  She reports she is additionally concerned because she had a massage last week and says that her uterus had to be replaced inside and she is concerned that because of this her pregnancy may not be in the correct position.    Past Medical History:  Diagnosis Date  . Anemia   . Obesity (BMI 30-39.9)    Family History  Problem Relation Age of Onset  . Hypertension Maternal Grandfather    Past Surgical History:  Procedure Laterality Date  . Mirena    . NO PAST SURGERIES      Short Social History:  Social History   Tobacco Use  . Smoking status: Never Smoker  . Smokeless tobacco: Never Used  Substance Use Topics  . Alcohol use: Not Currently    Allergies  Allergen Reactions  . Penicillins Shortness Of Breath and Swelling  . Penicillins Anaphylaxis  . Latex Swelling    No current outpatient medications on file.   No current facility-administered medications for this visit.    Review of Systems  Constitutional: Negative for chills, fatigue, fever and unexpected weight change.  HENT: Negative for trouble swallowing.   Eyes: Negative for loss of vision.  Respiratory: Negative for cough, shortness of breath and wheezing.  Cardiovascular: Negative for chest pain, leg swelling, palpitations and syncope.  GI: Negative for abdominal pain, blood in stool, diarrhea, nausea and vomiting.  GU: Negative for difficulty urinating, dysuria, frequency and hematuria.  Musculoskeletal: Negative for back pain, leg pain and joint pain.  Skin: Negative for rash.  Neurological: Negative for dizziness, headaches, light-headedness, numbness and seizures.  Psychiatric: Negative for behavioral problem, confusion, depressed mood and sleep disturbance.        Objective:  Objective   Vitals:   08/20/20 1456  BP: 128/72  Weight: 197 lb 3.2 oz (89.4 kg)  Height: 5\' 1"  (1.549 m)   Body mass index is 37.26 kg/m.  Physical Exam Vitals and nursing note reviewed. Exam conducted with a chaperone present.  Constitutional:      Appearance: Normal appearance. She is well-developed.  HENT:     Head: Normocephalic and atraumatic.  Eyes:     Extraocular Movements: Extraocular movements intact.     Pupils: Pupils are equal, round, and reactive to light.  Cardiovascular:     Rate and Rhythm: Normal rate and regular rhythm.  Pulmonary:     Effort: Pulmonary effort is normal. No respiratory distress.     Breath sounds: Normal breath sounds.  Abdominal:     General: Abdomen is flat. There is no  distension.     Palpations: Abdomen is soft.     Tenderness: There is no abdominal tenderness.  Musculoskeletal:        General: No signs of injury.  Skin:    General: Skin is warm and dry.  Neurological:     Mental Status: She is alert and oriented to person, place, and time.  Psychiatric:        Behavior: Behavior normal.        Thought Content: Thought content normal.        Judgment: Judgment normal.    Bedside TVUS showed possible elongated gestational sac. No yolk sac, no fetal pole.   Assessment/Plan:     25 yo  G4P3003, early stage pregnancy, unknown LMP.   Will check beta hcg today  Pelvic US ordered to be performed at hospital Follow up early next week to repeat beta hcg quant.  Ectopic precautions discussed.   Adelene Idler MD [facility], [facility] 08/20/2020 3:13 PM

## 2020-08-20 NOTE — Patient Instructions (Signed)
Ectopic Pregnancy  An ectopic pregnancy happens when a fertilized egg attaches (implants) outside the uterus. In a normal pregnancy, a fertilized egg implants in the uterus. An ectopic pregnancy cannot develop into a healthy baby. Most ectopic pregnancies occur in one of the fallopian tubes, which is where an egg travels from an ovary to get to the uterus. This is called a tubal pregnancy. An ectopic pregnancy can also happen on an ovary, on the cervix, or in the abdomen. When a fertilized egg implants on tissue outside the uterus and begins to grow, it may cause the tissue to tear or burst. This is known as a ruptured ectopic pregnancy. The tear or burst causes internal bleeding. This may cause intense pain in the abdomen. An ectopic pregnancy is a medical emergency and can be life-threatening. What are the causes? The most common cause of this condition is damage to one of the fallopian tubes. A fallopian tube may be narrowed or blocked, and that stops the fertilized egg from reaching the uterus. Sometimes, the cause of this condition is not known. What increases the risk? The following factors may make you more likely to develop this condition:  Having gone through infertility treatment before.  Having had an ectopic pregnancy before.  Having had surgery to have the fallopian tubes tied.  Becoming pregnant while using an intrauterine device for birth control.  Taking birth control pills before the age of 16. Other risk factors include:  Smoking.  Alcohol use.  History of DES exposure. DES is a medicine that was used until 1971 and affected babies whose mothers took the medicine. What are the signs or symptoms? Common symptoms of this condition include:  Missing a menstrual period.  Nausea or tiredness.  Tender breasts.  Other normal pregnancy symptoms. Other symptoms may include:  Pain during sex.  Vaginal bleeding or spotting.  Cramping or pain in the lower abdomen.  A  fast heartbeat, low blood pressure, and sweating.  Pain or increased pressure while having a bowel movement. Symptoms of a ruptured ectopic pregnancy and internal bleeding may include:  Sudden, severe pain in the abdomen.  Dizziness, weakness, feeling light-headed, or fainting.  Pain in the shoulder or neck area. How is this diagnosed? This condition is diagnosed by:  A blood test to check for the pregnancy hormone.  A pelvic exam to find painful areas or a mass in the abdomen.  Ultrasound. A probe is inserted into the vagina to see if there is a pregnancy in or outside the uterus.  Taking a sample of tissue from the uterus.  Surgery to look closely at the fallopian tubes through an incision in the abdomen. How is this treated? This condition is usually treated with medicine or surgery. Sometimes, ectopic pregnancies can resolve on their own, under close monitoring by your health care provider. Medicine A medicine called methotrexate may be given to cause the pregnancy tissue to be absorbed. The medicine may be given if:  The diagnosis is made early, with no signs of active bleeding.  The fallopian tube has not torn or burst. You will need blood tests to make sure the medicine is working. It may take 4-6 weeks for the pregnancy tissues to be absorbed. Surgery Surgery may be performed to:  Remove the pregnancy tissue.  Stop internal bleeding.  Remove part or all of the fallopian tube.  Remove the uterus. This is rare. After surgery, you may need to have blood tests to make sure the surgery worked.   Follow these instructions at home: Medicines  Take over-the-counter and prescription medicines only as told by your health care provider.  Ask your health care provider if the medicine prescribed to you: ? Requires you to avoid driving or using machinery. ? Can cause constipation. You may need to take these actions to prevent or treat constipation:  Drink enough fluid to  keep your urine pale yellow.  Take over-the-counter or prescription medicines.  Eat foods that are high in fiber, such as beans, whole grains, and fresh fruits and vegetables.  Limit foods that are high in fat and processed sugars, such as fried or sweet foods. General instructions  Rest or limit your activity, if told by your health care provider.  Do not have sex or put anything in your vagina, such as tampons or douches, for 6 weeks or until your health care provider says it is safe.  Do not lift anything that is heavier than 10 lb (4.5 kg), or the limit that you are told, until your health care provider says that it is safe.  Return to your normal activities as told by your health care provider. Ask your health care provider what activities are safe for you.  Keep all follow-up visits. This is important. Contact a health care provider if:  You have a fever or chills.  You have nausea and vomiting. Get help right away if:  Your pain gets worse or is not relieved by medicine.  You feel dizzy or weak.  You feel light-headed or you faint.  You have sudden, severe pain in your abdomen.  You have sudden pain in the shoulder or neck area. Summary  An ectopic pregnancy happens when a fertilized egg implants outside the uterus. Most ectopic pregnancies occur in one of the fallopian tubes.  An ectopic pregnancy is a medical emergency and can be life-threatening.  The most common cause of this condition is damage to one of the fallopian tubes.  This condition is usually treated with medicine or surgery. Some ectopic pregnancies resolve on their own, under close monitoring by your health care provider. This information is not intended to replace advice given to you by your health care provider. Make sure you discuss any questions you have with your health care provider. Document Revised: 09/17/2019 Document Reviewed: 09/17/2019 Elsevier Patient Education  2021 Elsevier Inc.  

## 2020-08-20 NOTE — Progress Notes (Signed)
pc °

## 2020-08-20 NOTE — Telephone Encounter (Signed)
Pt calling; has had a positive preg test; had a massage a week ago; uterus has dropped; IC hurts; concerned a baby will be way too difficult for her.  [phone]  Pt states the massage was to get the uterus back in correct place; pt's concern is the baby not being in the correct place in the uterus since it has fallen.  Tx'd to [facility] for scheduling.

## 2020-08-21 ENCOUNTER — Ambulatory Visit: Payer: Medicaid Other

## 2020-08-21 LAB — BETA HCG QUANT (REF LAB): hCG Quant: 358 m[IU]/mL

## 2020-08-24 ENCOUNTER — Other Ambulatory Visit: Payer: Self-pay

## 2020-08-24 ENCOUNTER — Encounter: Payer: Self-pay | Admitting: Obstetrics and Gynecology

## 2020-08-24 ENCOUNTER — Ambulatory Visit (INDEPENDENT_AMBULATORY_CARE_PROVIDER_SITE_OTHER): Payer: Medicaid Other | Admitting: Obstetrics and Gynecology

## 2020-08-24 VITALS — BP 118/70 | Ht 61.0 in | Wt 194.8 lb

## 2020-08-24 DIAGNOSIS — Z3490 Encounter for supervision of normal pregnancy, unspecified, unspecified trimester: Secondary | ICD-10-CM

## 2020-08-24 DIAGNOSIS — O3680X0 Pregnancy with inconclusive fetal viability, not applicable or unspecified: Secondary | ICD-10-CM

## 2020-08-24 NOTE — Patient Instructions (Signed)
Ectopic Pregnancy  An ectopic pregnancy happens when a fertilized egg attaches (implants) outside the uterus. In a normal pregnancy, a fertilized egg implants in the uterus. An ectopic pregnancy cannot develop into a healthy baby. Most ectopic pregnancies occur in one of the fallopian tubes, which is where an egg travels from an ovary to get to the uterus. This is called a tubal pregnancy. An ectopic pregnancy can also happen on an ovary, on the cervix, or in the abdomen. When a fertilized egg implants on tissue outside the uterus and begins to grow, it may cause the tissue to tear or burst. This is known as a ruptured ectopic pregnancy. The tear or burst causes internal bleeding. This may cause intense pain in the abdomen. An ectopic pregnancy is a medical emergency and can be life-threatening. What are the causes? The most common cause of this condition is damage to one of the fallopian tubes. A fallopian tube may be narrowed or blocked, and that stops the fertilized egg from reaching the uterus. Sometimes, the cause of this condition is not known. What increases the risk? The following factors may make you more likely to develop this condition:  Having gone through infertility treatment before.  Having had an ectopic pregnancy before.  Having had surgery to have the fallopian tubes tied.  Becoming pregnant while using an intrauterine device for birth control.  Taking birth control pills before the age of 16. Other risk factors include:  Smoking.  Alcohol use.  History of DES exposure. DES is a medicine that was used until 1971 and affected babies whose mothers took the medicine. What are the signs or symptoms? Common symptoms of this condition include:  Missing a menstrual period.  Nausea or tiredness.  Tender breasts.  Other normal pregnancy symptoms. Other symptoms may include:  Pain during sex.  Vaginal bleeding or spotting.  Cramping or pain in the lower abdomen.  A  fast heartbeat, low blood pressure, and sweating.  Pain or increased pressure while having a bowel movement. Symptoms of a ruptured ectopic pregnancy and internal bleeding may include:  Sudden, severe pain in the abdomen.  Dizziness, weakness, feeling light-headed, or fainting.  Pain in the shoulder or neck area. How is this diagnosed? This condition is diagnosed by:  A blood test to check for the pregnancy hormone.  A pelvic exam to find painful areas or a mass in the abdomen.  Ultrasound. A probe is inserted into the vagina to see if there is a pregnancy in or outside the uterus.  Taking a sample of tissue from the uterus.  Surgery to look closely at the fallopian tubes through an incision in the abdomen. How is this treated? This condition is usually treated with medicine or surgery. Sometimes, ectopic pregnancies can resolve on their own, under close monitoring by your health care provider. Medicine A medicine called methotrexate may be given to cause the pregnancy tissue to be absorbed. The medicine may be given if:  The diagnosis is made early, with no signs of active bleeding.  The fallopian tube has not torn or burst. You will need blood tests to make sure the medicine is working. It may take 4-6 weeks for the pregnancy tissues to be absorbed. Surgery Surgery may be performed to:  Remove the pregnancy tissue.  Stop internal bleeding.  Remove part or all of the fallopian tube.  Remove the uterus. This is rare. After surgery, you may need to have blood tests to make sure the surgery worked.   Follow these instructions at home: Medicines  Take over-the-counter and prescription medicines only as told by your health care provider.  Ask your health care provider if the medicine prescribed to you: ? Requires you to avoid driving or using machinery. ? Can cause constipation. You may need to take these actions to prevent or treat constipation:  Drink enough fluid to  keep your urine pale yellow.  Take over-the-counter or prescription medicines.  Eat foods that are high in fiber, such as beans, whole grains, and fresh fruits and vegetables.  Limit foods that are high in fat and processed sugars, such as fried or sweet foods. General instructions  Rest or limit your activity, if told by your health care provider.  Do not have sex or put anything in your vagina, such as tampons or douches, for 6 weeks or until your health care provider says it is safe.  Do not lift anything that is heavier than 10 lb (4.5 kg), or the limit that you are told, until your health care provider says that it is safe.  Return to your normal activities as told by your health care provider. Ask your health care provider what activities are safe for you.  Keep all follow-up visits. This is important. Contact a health care provider if:  You have a fever or chills.  You have nausea and vomiting. Get help right away if:  Your pain gets worse or is not relieved by medicine.  You feel dizzy or weak.  You feel light-headed or you faint.  You have sudden, severe pain in your abdomen.  You have sudden pain in the shoulder or neck area. Summary  An ectopic pregnancy happens when a fertilized egg implants outside the uterus. Most ectopic pregnancies occur in one of the fallopian tubes.  An ectopic pregnancy is a medical emergency and can be life-threatening.  The most common cause of this condition is damage to one of the fallopian tubes.  This condition is usually treated with medicine or surgery. Some ectopic pregnancies resolve on their own, under close monitoring by your health care provider. This information is not intended to replace advice given to you by your health care provider. Make sure you discuss any questions you have with your health care provider. Document Revised: 09/17/2019 Document Reviewed: 09/17/2019 Elsevier Patient Education  2021 Elsevier Inc.  

## 2020-08-24 NOTE — Progress Notes (Signed)
Patient ID: Judith Beard, female   DOB: 1995/09/05, 25 y.o.   MRN: [id number]  Reason for Consult: Gynecologic Exam   Referred by No ref. provider found  Subjective:     HPI:  Judith Beard is a 25 y.o. female. She reports she is feeling well. Yesterday she has a small amount of left sided cramping. She reports mild headache the last two days in the morning when she woke up. She does not generally have headaches. She did not try tylenol for the headache. She has not had vaginal bleeding.    Past Medical History:  Diagnosis Date  . Anemia   . Obesity (BMI 30-39.9)    Family History  Problem Relation Age of Onset  . Hypertension Maternal Grandfather    Past Surgical History:  Procedure Laterality Date  . Mirena    . NO PAST SURGERIES      Short Social History:  Social History   Tobacco Use  . Smoking status: Never Smoker  . Smokeless tobacco: Never Used  Substance Use Topics  . Alcohol use: Not Currently    Allergies  Allergen Reactions  . Penicillins Shortness Of Breath and Swelling  . Penicillins Anaphylaxis  . Latex Swelling    No current outpatient medications on file.   No current facility-administered medications for this visit.    Review of Systems  Constitutional: Negative for chills, fatigue, fever and unexpected weight change.  HENT: Negative for trouble swallowing.  Eyes: Negative for loss of vision.  Respiratory: Negative for cough, shortness of breath and wheezing.  Cardiovascular: Negative for chest pain, leg swelling, palpitations and syncope.  GI: Negative for abdominal pain, blood in stool, diarrhea, nausea and vomiting.  GU: Negative for difficulty urinating, dysuria, frequency and hematuria.  Musculoskeletal: Negative for back pain, leg pain and joint pain.  Skin: Negative for rash.  Neurological: Negative for dizziness, headaches, light-headedness, numbness and seizures.  Psychiatric: Negative for behavioral problem,  confusion, depressed mood and sleep disturbance.        Objective:  Objective   Vitals:   08/24/20 1113  BP: 118/70  Weight: 194 lb 12.8 oz (88.4 kg)  Height: 5\' 1"  (1.549 m)   Body mass index is 36.81 kg/m.  Physical Exam Vitals and nursing note reviewed. Exam conducted with a chaperone present.  Constitutional:      Appearance: Normal appearance.  HENT:     Head: Normocephalic and atraumatic.  Eyes:     Extraocular Movements: Extraocular movements intact.     Pupils: Pupils are equal, round, and reactive to light.  Cardiovascular:     Rate and Rhythm: Normal rate and regular rhythm.  Pulmonary:     Effort: Pulmonary effort is normal.     Breath sounds: Normal breath sounds.  Abdominal:     General: Abdomen is flat.     Palpations: Abdomen is soft.  Musculoskeletal:     Cervical back: Normal range of motion.  Skin:    General: Skin is warm and dry.  Neurological:     General: No focal deficit present.     Mental Status: She is alert and oriented to person, place, and time.  Psychiatric:        Behavior: Behavior normal.        Thought Content: Thought content normal.        Judgment: Judgment normal.     Assessment/Plan:     25 y.o. 21 with a pregnancy of unknown location,  The patient does not  have identifiable risk factors which would increase her risk of ectopic pregnancy (Approximately 50% of all patient presenting with ectopic pregnancy will have identifiable risk factors such as prior GC/CT, PID, or ART).  The discriminatory zone for visualizing a singleton intrauterine pregnancy is an HCG level of approximately 1539mIU/mL to 2570mIU/mL, although the recommended value to rule out an intrauterine pregnancy has been set conservatively high at 3540mIU/mL to account for twin gestations which occur at a rate of approximately 2% of all spontaneous conceptions.  Serial HCG measurements are more helpful in determining the patients risk of ectopic than a single  value.  Demonstration of adnexal mass with a hypoechoic center on ultrasound has a positive predictive value of approximately 80% and may misidentify corpus luteum cysts, paratubal cysts, and or hydrosalpinx among others.  An intrauterine gestational sac makes ectopic pregnancy less likely but may represent pseudosac.  The diagnosis of intrauterine pregnancy requires documentation of an intrauterine gestational sac with accompanying yolk sac or fetal pole.  Heterotopic pregnancy is exceedingly rare but may still be present in cases of confirmed intrauterine pregnancy with a reported incidence of 1 in 4,000 to 1 in 30,000.   Ruptured ectopic pregnancy continued to account for 2.7% of all maternal deaths, and the incidence of ectopic pregnancy is reported at approximately 2% of all spontaneous conceptions.  The patient understand the importance of returning for repeat HCG measurement in 48-hrs, as well as follow up ultrasound.  She is currently hemodynamically stable, with a non-acute pelvic and abdominal exam, and reliable.  This is a desired pregnancy.  She was given strict precautions should she develop acute abdominal pain and or orthostatic symptoms.  ACOG Practice Bulletin 191 February 2018 "Ectopic Pregnancy"  Will plan beta hcg today, assuming it is trending up appropriately will repeat on Wednesday and Friday.  Discussed ectopic pregnancy precautions.  Follow up with the patient in office in 1 week.     Adelene Idler MD [facility], [facility] 08/24/2020 11:24 AM

## 2020-08-25 LAB — BETA HCG QUANT (REF LAB): hCG Quant: 1765 m[IU]/mL

## 2020-08-26 ENCOUNTER — Other Ambulatory Visit: Payer: Medicaid Other

## 2020-08-26 ENCOUNTER — Other Ambulatory Visit: Payer: Self-pay

## 2020-08-26 DIAGNOSIS — Z3490 Encounter for supervision of normal pregnancy, unspecified, unspecified trimester: Secondary | ICD-10-CM

## 2020-08-26 DIAGNOSIS — O3680X0 Pregnancy with inconclusive fetal viability, not applicable or unspecified: Secondary | ICD-10-CM

## 2020-08-27 ENCOUNTER — Other Ambulatory Visit: Payer: Self-pay | Admitting: Obstetrics and Gynecology

## 2020-08-27 DIAGNOSIS — Z3490 Encounter for supervision of normal pregnancy, unspecified, unspecified trimester: Secondary | ICD-10-CM

## 2020-08-27 DIAGNOSIS — O3680X0 Pregnancy with inconclusive fetal viability, not applicable or unspecified: Secondary | ICD-10-CM

## 2020-08-27 LAB — BETA HCG QUANT (REF LAB): hCG Quant: 3934 m[IU]/mL

## 2020-08-28 ENCOUNTER — Other Ambulatory Visit: Payer: Medicaid Other

## 2020-08-28 ENCOUNTER — Other Ambulatory Visit: Payer: Self-pay

## 2020-08-28 DIAGNOSIS — Z3490 Encounter for supervision of normal pregnancy, unspecified, unspecified trimester: Secondary | ICD-10-CM

## 2020-08-28 DIAGNOSIS — O3680X0 Pregnancy with inconclusive fetal viability, not applicable or unspecified: Secondary | ICD-10-CM

## 2020-08-29 LAB — BETA HCG QUANT (REF LAB): hCG Quant: 7207 m[IU]/mL

## 2020-08-31 ENCOUNTER — Other Ambulatory Visit: Payer: Self-pay

## 2020-08-31 ENCOUNTER — Ambulatory Visit (INDEPENDENT_AMBULATORY_CARE_PROVIDER_SITE_OTHER): Payer: Medicaid Other | Admitting: Obstetrics and Gynecology

## 2020-08-31 ENCOUNTER — Encounter: Payer: Self-pay | Admitting: Obstetrics and Gynecology

## 2020-08-31 VITALS — BP 120/70 | Ht 61.0 in | Wt 192.8 lb

## 2020-08-31 DIAGNOSIS — O3680X0 Pregnancy with inconclusive fetal viability, not applicable or unspecified: Secondary | ICD-10-CM

## 2020-08-31 DIAGNOSIS — N926 Irregular menstruation, unspecified: Secondary | ICD-10-CM | POA: Diagnosis not present

## 2020-08-31 DIAGNOSIS — Z3490 Encounter for supervision of normal pregnancy, unspecified, unspecified trimester: Secondary | ICD-10-CM | POA: Diagnosis not present

## 2020-08-31 NOTE — Progress Notes (Signed)
Patient ID: Judith Beard, female   DOB: [birth year], 25 y.o.   MRN: [id number]  Reason for Consult: No chief complaint on file.   Referred by No ref. provider found  Subjective:     HPI:  Judith Beard is a 25 y.o. female. She is following up regarding pregnancy of unknown location. She has not been having pelvic pain or bleeding. Her beta hcg has been going up appropriately. She has an official US planned for Friday of this week.   Gynecological History   Past Medical History:  Diagnosis Date  . Anemia   . Obesity (BMI 30-39.9)    Family History  Problem Relation Age of Onset  . Hypertension Maternal Grandfather    Past Surgical History:  Procedure Laterality Date  . Mirena    . NO PAST SURGERIES      Short Social History:  Social History   Tobacco Use  . Smoking status: Never Smoker  . Smokeless tobacco: Never Used  Substance Use Topics  . Alcohol use: Not Currently    Allergies  Allergen Reactions  . Penicillins Shortness Of Breath and Swelling  . Penicillins Anaphylaxis  . Latex Swelling    No current outpatient medications on file.   No current facility-administered medications for this visit.    Review of Systems  Constitutional: Negative for chills, fatigue, fever and unexpected weight change.  HENT: Negative for trouble swallowing.  Eyes: Negative for loss of vision.  Respiratory: Negative for cough, shortness of breath and wheezing.  Cardiovascular: Negative for chest pain, leg swelling, palpitations and syncope.  GI: Negative for abdominal pain, blood in stool, diarrhea, nausea and vomiting.  GU: Negative for difficulty urinating, dysuria, frequency and hematuria.  Musculoskeletal: Negative for back pain, leg pain and joint pain.  Skin: Negative for rash.  Neurological: Negative for dizziness, headaches, light-headedness, numbness and seizures.  Psychiatric: Negative for behavioral problem, confusion, depressed mood and sleep  disturbance.        Objective:  Objective   Vitals:   08/31/20 1619  BP: 120/70  Weight: 192 lb 12.8 oz (87.5 kg)  Height: 5\' 1"  (1.549 m)   Body mass index is 36.43 kg/m.  Physical Exam Vitals and nursing note reviewed. Exam conducted with a chaperone present.  Constitutional:      Appearance: Normal appearance.  HENT:     Head: Normocephalic and atraumatic.  Eyes:     Extraocular Movements: Extraocular movements intact.     Pupils: Pupils are equal, round, and reactive to light.  Cardiovascular:     Rate and Rhythm: Normal rate and regular rhythm.  Pulmonary:     Effort: Pulmonary effort is normal.     Breath sounds: Normal breath sounds.  Abdominal:     General: Abdomen is flat. There is no distension.     Palpations: Abdomen is soft.     Tenderness: There is no abdominal tenderness. There is no guarding or rebound.  Musculoskeletal:     Cervical back: Normal range of motion.  Skin:    General: Skin is warm and dry.  Neurological:     General: No focal deficit present.     Mental Status: She is alert and oriented to person, place, and time.  Psychiatric:        Behavior: Behavior normal.        Thought Content: Thought content normal.        Judgment: Judgment normal.     Assessment/Plan:     25 yo  with pregnancy of unknown location.  Abdominal US today at bedside shows what appears to be an early IUP with a yolk sac.  Follow up for official US. Notify office of any pain or bleeding.   NOB follow up in 1 week  More than 20 minutes were spent face to face with the patient in the room, reviewing the medical record, labs and images, and coordinating care for the patient. The plan of management was discussed in detail and counseling was provided.      Adelene Idler MD [facility], [facility] 08/31/2020 5:34 PM

## 2020-09-04 ENCOUNTER — Other Ambulatory Visit: Payer: Self-pay

## 2020-09-04 ENCOUNTER — Ambulatory Visit
Admission: RE | Admit: 2020-09-04 | Discharge: 2020-09-04 | Disposition: A | Discharge status: Home / Self Care | Payer: Medicaid Other | Source: Ambulatory Visit | Attending: Obstetrics and Gynecology | Admitting: Obstetrics and Gynecology

## 2020-09-04 DIAGNOSIS — Z3490 Encounter for supervision of normal pregnancy, unspecified, unspecified trimester: Secondary | ICD-10-CM | POA: Diagnosis not present

## 2020-09-04 DIAGNOSIS — O3680X0 Pregnancy with inconclusive fetal viability, not applicable or unspecified: Secondary | ICD-10-CM | POA: Insufficient documentation

## 2020-09-15 ENCOUNTER — Other Ambulatory Visit (HOSPITAL_COMMUNITY)
Admission: RE | Admit: 2020-09-15 | Discharge: 2020-09-15 | Disposition: A | Discharge status: Home / Self Care | Payer: Medicaid Other | Source: Ambulatory Visit | Attending: Obstetrics and Gynecology | Admitting: Obstetrics and Gynecology

## 2020-09-15 ENCOUNTER — Encounter: Payer: Self-pay | Admitting: Obstetrics and Gynecology

## 2020-09-15 ENCOUNTER — Ambulatory Visit (INDEPENDENT_AMBULATORY_CARE_PROVIDER_SITE_OTHER): Payer: Medicaid Other | Admitting: Obstetrics and Gynecology

## 2020-09-15 ENCOUNTER — Other Ambulatory Visit: Payer: Self-pay

## 2020-09-15 VITALS — BP 122/70 | Wt 193.0 lb

## 2020-09-15 DIAGNOSIS — Z3490 Encounter for supervision of normal pregnancy, unspecified, unspecified trimester: Secondary | ICD-10-CM | POA: Insufficient documentation

## 2020-09-15 DIAGNOSIS — Z3481 Encounter for supervision of other normal pregnancy, first trimester: Secondary | ICD-10-CM

## 2020-09-15 DIAGNOSIS — Z1379 Encounter for other screening for genetic and chromosomal anomalies: Secondary | ICD-10-CM

## 2020-09-15 DIAGNOSIS — O210 Mild hyperemesis gravidarum: Secondary | ICD-10-CM

## 2020-09-15 DIAGNOSIS — Z3A10 10 weeks gestation of pregnancy: Secondary | ICD-10-CM

## 2020-09-15 MED ORDER — PROMETHAZINE HCL 25 MG PO TABS
25.0000 mg | ORAL_TABLET | Freq: Four times a day (QID) | ORAL | 3 refills | Status: DC | PRN
Start: 2020-09-15 — End: 2021-04-25

## 2020-09-15 MED ORDER — ONDANSETRON 4 MG PO TBDP: 4.0000 mg | ORAL_TABLET | Freq: Four times a day (QID) | ORAL | 3 refills | Status: DC | PRN

## 2020-09-15 NOTE — Addendum Note (Signed)
Addended by: Adelene Idler on: 09/15/2020 03:30 PM   Modules accepted: Orders

## 2020-09-15 NOTE — Progress Notes (Signed)
09/15/2020   Chief Complaint: Missed period  Transfer of Care Patient: no  History of Present Illness: Ms. Judith Beard is a 25 y.o. G5P3003 [redacted]w[redacted]d based on Patient's last menstrual period was 07/03/2020. with an Estimated Date of Delivery: 04/09/21, with the above CC.   Her periods were: last menstrual period 07/03/2020 She was using no method when she conceived.  She has Positive signs or symptoms of nausea/vomiting of pregnancy. She has Negative signs or symptoms of miscarriage or preterm labor She was not taking different medications around the time she conceived/early pregnancy. Since her LMP, she has not used alcohol Since her LMP, she has not used tobacco products Since her LMP, she has not used illegal drugs.    Current or past history of domestic violence. no  Infection History:  1. Since her LMP, she has not had a viral illness.  2. She admits to close contact with children on a regular basis     3. She has a history of chicken pox. She reports vaccination for chicken pox in the past. 4. Patient or partner has history of genital herpes  no 5. History of STI (GC, CT, HPV, syphilis, HIV)  no   6.  She does not live with someone with TB or TB exposed. 7. History of recent travel :  no 8. She identifies Negative Zika risk factors for her and her partner 53. There are not cats in the home in the home.  She understands that while pregnant she should not change cat litter.   Genetic Screening Questions: (Includes patient, baby's father, or anyone in either family)   1. Patient's age >/= 73 at [facility]  no 2. Thalassemia ([place], [place], Mediterranean, or Asian background): MCV<80  no 3. Neural tube defect (meningomyelocele, spina bifida, anencephaly)  no 4. Congenital heart defect  no  5. Down syndrome  no 6. Tay-Sachs (Jewish, [place]  no 7. Canavan's Disease  no 8. Sickle cell disease or trait (African)  no  9. Hemophilia or other blood disorders  no  10. Muscular  dystrophy  no  11. Cystic fibrosis  no  12. Huntington's Chorea  no  13. Mental retardation/autism  no 14. Other inherited genetic or chromosomal disorder  no 15. Maternal metabolic disorder (DM, PKU, etc)  no 16. Patient or FOB with a child with a birth defect not listed above no  16a. Patient or FOB with a birth defect themselves no 17. Recurrent pregnancy loss, or stillbirth  no  18. Any medications since LMP other than prenatal vitamins (include vitamins, supplements, OTC meds, drugs, alcohol)  no 19. Any other genetic/environmental exposure to discuss  no  ROS:  Review of Systems  Constitutional: Negative for chills, fever, malaise/fatigue and weight loss.  HENT: Negative for congestion, hearing loss and sinus pain.   Eyes: Negative for blurred vision and double vision.  Respiratory: Negative for cough, sputum production, shortness of breath and wheezing.   Cardiovascular: Negative for chest pain, palpitations, orthopnea and leg swelling.  Gastrointestinal: Negative for abdominal pain, constipation, diarrhea, nausea and vomiting.  Genitourinary: Negative for dysuria, flank pain, frequency, hematuria and urgency.  Musculoskeletal: Negative for back pain, falls and joint pain.  Skin: Negative for itching and rash.  Neurological: Negative for dizziness and headaches.  Psychiatric/Behavioral: Negative for depression, substance abuse and suicidal ideas. The patient is not nervous/anxious.     OBGYN History: As per HPI. OB History  Gravida Para Term Preterm AB Living  5 3 3  0 0  3  SAB IAB Ectopic Multiple Live Births  0 0 0   3    # Outcome Date GA Lbr Len/2nd Weight Sex Delivery Anes PTL Lv  5 Current           4 Term 02/23/18 [redacted]w[redacted]d / 02:43 6 lb 13.7 oz (3.11 kg) F Vag-Spont EPI  LIV  3 Term 04/15/14   7 lb 10 oz (3.459 kg) M Vag-Spont EPI  LIV  2 Term 04/15/14 [redacted]w[redacted]d  7 lb 10 oz (3.459 kg) M Vag-Spont   LIV     Birth Comments: IOL  1 Gravida             Any issues with any  prior pregnancies: no Any prior children are healthy, doing well, without any problems or issues: yes Last pap smear 2021 NIL History of STIs: No   Past Medical History: Past Medical History:  Diagnosis Date  . Anemia   . Obesity (BMI 30-39.9)     Past Surgical History: Past Surgical History:  Procedure Laterality Date  . Mirena    . NO PAST SURGERIES      Family History:  Family History  Problem Relation Age of Onset  . Hypertension Maternal Grandfather    She denies any female cancers, bleeding or blood clotting disorders.   Social History:  Social History   Socioeconomic History  . Marital status: Single    Spouse name: Not on file  . Number of children: 1  . Years of education: 72  . Highest education level: Not on file  Occupational History  . 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 Occupation: unemployed  Tobacco Use  . Smoking status: Never Smoker  . Smokeless tobacco: Never Used  Vaping Use  . Vaping Use: Never used  Substance and Sexual Activity  . Alcohol use: Not Currently  . Drug use: Never  . Sexual activity: Yes    Comment: Mirena  Other Topics Concern  . Not on file  Social History Narrative   ** Merged History Encounter **       Social Determinants of Health   Financial Resource Strain: Not on file  Food Insecurity: Not on file  Transportation Needs: Not on file  Physical Activity: Not on file  Stress: Not on file  Social Connections: Not on file  Intimate Partner Violence: Not on file    Allergy: Allergies  Allergen Reactions  . Penicillins Shortness Of Breath and Swelling  . Penicillins Anaphylaxis  . Latex Swelling    Current Outpatient Medications: No current outpatient medications on file.   Physical Exam: Physical Exam Vitals and nursing note reviewed. Exam conducted with a chaperone present.  HENT:     Head: Normocephalic and atraumatic.  Eyes:     Pupils: Pupils are equal, round, and reactive to  light.  Neck:     Thyroid: No thyromegaly.  Cardiovascular:     Rate and Rhythm: Normal rate and regular rhythm.  Pulmonary:     Effort: Pulmonary effort is normal.  Abdominal:     General: Bowel sounds are normal. There is no distension.     Palpations: Abdomen is soft.     Tenderness: There is no abdominal tenderness. There is no guarding or rebound.  Musculoskeletal:        General: Normal range of motion.     Cervical back: Normal range of motion and neck supple.  Skin:    General: Skin is warm and  dry.  Neurological:     Mental Status: She is alert and oriented to person, place, and time.  Psychiatric:        Mood and Affect: Affect normal.        Judgment: Judgment normal.    Bedside US showed IUP with + FHR   Assessment: Judith Beard is a 25 y.o. [id number] [redacted]w[redacted]d based on Patient's last menstrual period was 07/03/2020. with an Estimated Date of Delivery: 04/09/21,  for prenatal care.  Plan:  1) Avoid alcoholic beverages. 2) Patient encouraged not to smoke.  3) Discontinue the use of all non-medicinal drugs and chemicals.  4) Take prenatal vitamins daily.  5) Seatbelt use advised 6) Nutrition, food safety (fish, cheese advisories, and high nitrite foods) and exercise discussed. 7) Hospital and practice style delivering at [facility] discussed  8) Patient is asked about travel to areas at risk for the Zika virus, and counseled to avoid travel and exposure to mosquitoes or sexual partners who may have themselves been exposed to the virus. Testing is discussed, and will be ordered as appropriate.  9) Childbirth classes at [facility] advised 10) Genetic Screening, such as with 1st Trimester Screening, cell free fetal DNA, AFP testing, and Ultrasound, as well as with amniocentesis and CVS as appropriate, is discussed with patient. She plans to have genetic testing this pregnancy.  Problem list reviewed and updated.  I discussed the assessment and treatment plan with the patient. The  patient was provided an opportunity to ask questions and all were answered. The patient agreed with the plan and demonstrated an understanding of the instructions.  Adelene Idler MD [facility], [facility] 09/15/2020 2:24 PM

## 2020-09-15 NOTE — Patient Instructions (Addendum)
Initial steps to help :   B6 (pyridoxine) 25 mg,  3-4 times a day- 200 mg a day total Unisom (doxylamine) 25 mg at bedtime **B6 and Unisom are available as a combination prescription medications called diclegis and bonjesta  B1 (thiamin)  50-100 mg 1-2 a day-  100 mg a day total  Continue prenatal vitamin with iron and thiamin. If it is not tolerated switch to 1 mg of folic acid.  Can add medication for gastric reflux if needed.  Subsequent steps to be added to B1, B6, and Unisom:  1. Antihistamine (one of the following medications) Dramamine      25-50 mg every 4-6 hours Benadryl      25-50 mg every 4-6 hours Meclizine      25 mg every 6 hours  2. Dopamine Antagonist (one of the following medications) Metoclopramide  (Reglan)  5-10 mg every 6-8 hours         PO Promethazine   (Phenergan)   12.5-25 mg every 4-6 hours      PO or rectal Prochlorperazine  (Compazine)  5-10 mg every 6-8 hours     25mg  BID rectally   Subsequent steps if there has still not been improvement in symptoms:  3. Daily stool softner:  Colace 100 mg twice a day  4. Ondansetron  (Zofran)   4-8 mg every 6-8 hours    Hyperemesis Gravidarum Hyperemesis gravidarum is a severe form of nausea and vomiting that happens during pregnancy. Hyperemesis is worse than morning sickness. It may cause you to have nausea or vomiting all day for many days. It may keep you from eating and drinking enough food and liquids, which can lead to dehydration, malnutrition, and weight loss. Hyperemesis usually occurs during the first half (the first 20 weeks) of pregnancy. It often goes away once a woman is in her second half of pregnancy. However, sometimes hyperemesis continues through an entire pregnancy. What are the causes? The cause of this condition is not known. It may be associated with:  Changes in hormones in the body during pregnancy.  Changes in the gastrointestinal system.  Genetic or inherited conditions. What are the  signs or symptoms? Symptoms of this condition include:  Severe nausea and vomiting that does not go away.  Problems keeping food down.  Weight loss.  Loss of body fluid (dehydration).  Loss of appetite. You may have no desire to eat or you may not like the food you have previously enjoyed. How is this diagnosed? This condition may be diagnosed based on your medical history, your symptoms, and a physical exam. You may also have other tests, including:  Blood tests.  Urine tests.  Blood pressure tests.  Ultrasound to look for problems with the placenta or to check if you are pregnant with more than one baby. How is this treated? This condition is managed by controlling symptoms. This may include:  Following an eating plan. This can help to lessen nausea and vomiting.  Treatments that do not use medicine. These include acupressure bracelets, hypnosis, and eating or drinking foods or fluids that contain ginger, ginger ale, or ginger tea.  Taking prescription medicine or over-the-counter medicine as told by your health care provider.  Continuing to take prenatal vitamins. You may need to change what kind you take and when you take them. Follow your health care provider's instructions about prenatal vitamins. An eating plan and medicines are often used together to help control symptoms. If medicines do not help relieve  nausea and vomiting, you may need to receive fluids through an IV at the hospital. Follow these instructions at home: To help relieve your symptoms, listen to your body. Everyone is different and has different preferences. Find what works best for you. Here are some things you can try to help relieve your symptoms: Meals and snacks  Eat 5-6 small meals daily instead of 3 large meals. Eating small meals and snacks can help you avoid an empty stomach.  Before getting out of bed, eat a couple of crackers to avoid moving around on an empty stomach.  Eat a protein-rich  snack before bed. Examples include cheese and crackers, or a peanut butter sandwich made with 1 slice of whole-wheat bread and 1 tsp (5 g) of peanut butter.  Eat and drink slowly.  Try eating starchy foods as these are usually tolerated well. Examples include cereal, toast, bread, potatoes, pasta, rice, and pretzels.  Eat at least one serving of protein with your meals and snacks. Protein options include lean meats, poultry, seafood, beans, nuts, nut butters, eggs, cheese, and yogurt.  Eat or suck on things that have ginger in them. It may help to relieve nausea. Add  tsp (0.44 g) ground ginger to hot tea, or choose ginger tea.   Fluids It is important to stay hydrated. Try to:  Drink small amounts of fluids often.  Drink fluids 30 minutes before or after a meal to help lessen the feeling of a full stomach.  Drink 100% fruit juice or an electrolyte drink. An electrolyte drink contains sodium, potassium, and chloride.  Drink fluids that are cold, clear, and carbonated or sour. These include lemonade, ginger ale, lemon-lime soda, ice water, and sparkling water. Things to avoid Avoid the following:  Eating foods that trigger your symptoms. These may include spicy foods, coffee, high-fat foods, very sweet foods, and acidic foods.  Drinking more than 1 cup of fluid at a time.  Skipping meals. Nausea can be more intense on an empty stomach. If you cannot tolerate food, do not force it. Try sucking on ice chips or other frozen items and make up for missed calories later.  Lying down within 2 hours after eating.  Being exposed to environmental triggers. These may include food smells, smoky rooms, closed spaces, rooms with strong smells, warm or humid places, overly loud and noisy rooms, and rooms with motion or flickering lights. Try eating meals in a well-ventilated area that is free of strong smells.  Making quick and sudden changes in your movement.  Taking iron pills and multivitamins  that contain iron. If you take prescription iron pills, do not stop taking them unless your health care provider approves.  Preparing food. The smell of food can spoil your appetite or trigger nausea. General instructions  Brush your teeth or use a mouth rinse after meals.  Take over-the-counter and prescription medicines only as told by your health care provider.  Follow instructions from your health care provider about eating or drinking restrictions.  Talk with your health care provider about starting a supplement of vitamin B6.  Continue to take your prenatal vitamins as told by your health care provider. If you are having trouble taking your prenatal vitamins, talk with your health care provider about other options.  Keep all follow-up visits. This is important. Follow-up visits include prenatal visits. Contact a health care provider if:  You have pain in your abdomen.  You have a severe headache.  You have vision problems.  You  You are losing weight.  You feel weak or dizzy.  You cannot eat or drink without vomiting, especially if this goes on for a full day. Get help right away if:  You cannot drink fluids without vomiting.  You vomit blood.  You have constant nausea and vomiting.  You are very weak.  You faint.  You have a fever and your symptoms suddenly get worse. Summary  Hyperemesis gravidarum is a severe form of nausea and vomiting that happens during pregnancy.  Making some changes to your eating habits may help relieve nausea and vomiting.  This condition may be managed with lifestyle changes and medicines as prescribed by your health care provider.  If medicines do not help relieve nausea and vomiting, you may need to receive fluids through an IV at the hospital. This information is not intended to replace advice given to you by your health care provider. Make sure you discuss any questions you have with your health care provider. Document  Revised: 12/30/2019 Document Reviewed: 12/30/2019 Elsevier Patient Education  2021 [employer] Inc.     First Trimester of Pregnancy  The first trimester of pregnancy starts on the first day of your last menstrual period until the end of week 12. This is months 1 through 3 of pregnancy. A week after a sperm fertilizes an egg, the egg will implant into the wall of the uterus and begin to develop into a baby. By the end of 12 weeks, all the baby's organs will be formed and the baby will be 2-3 inches in size. Body changes during your first trimester Your body goes through many changes during pregnancy. The changes vary and generally return to normal after your baby is born. Physical changes  You may gain or lose weight.  Your breasts may begin to grow larger and become tender. The tissue that surrounds your nipples (areola) may become darker.  Dark spots or blotches (chloasma or mask of pregnancy) may develop on your face.  You may have changes in your hair. These can include thickening or thinning of your hair or changes in texture. Health changes  You may feel nauseous, and you may vomit.  You may have heartburn.  You may develop headaches.  You may develop constipation.  Your gums may bleed and may be sensitive to brushing and flossing. Other changes  You may tire easily.  You may urinate more often.  Your menstrual periods will stop.  You may have a loss of appetite.  You may develop cravings for certain kinds of food.  You may have changes in your emotions from day to day.  You may have more vivid and strange dreams. Follow these instructions at home: Medicines  Follow your health care provider's instructions regarding medicine use. Specific medicines may be either safe or unsafe to take during pregnancy. Do not take any medicines unless told to by your health care provider.  Take a prenatal vitamin that contains at least 600 micrograms (mcg) of folic acid. Eating  and drinking  Eat a healthy diet that includes fresh fruits and vegetables, whole grains, good sources of protein such as meat, eggs, or tofu, and low-fat dairy products.  Avoid raw meat and unpasteurized juice, milk, and cheese. These carry germs that can harm you and your baby.  If you feel nauseous or you vomit: ? Eat 4 or 5 small meals a day instead of 3 large meals. ? Try eating a few soda crackers. ? Drink liquids between meals instead of   during meals.  You may need to take these actions to prevent or treat constipation: ? Drink enough fluid to keep your urine pale yellow. ? Eat foods that are high in fiber, such as beans, whole grains, and fresh fruits and vegetables. ? Limit foods that are high in fat and processed sugars, such as fried or sweet foods. Activity  Exercise only as directed by your health care provider. Most people can continue their usual exercise routine during pregnancy. Try to exercise for 30 minutes at least 5 days a week.  Stop exercising if you develop pain or cramping in the lower abdomen or lower back.  Avoid exercising if it is very hot or humid or if you are at high altitude.  Avoid heavy lifting.  If you choose to, you may have sex unless your health care provider tells you not to. Relieving pain and discomfort  Wear a good support bra to relieve breast tenderness.  Rest with your legs elevated if you have leg cramps or low back pain.  If you develop bulging veins (varicose veins) in your legs: ? Wear support hose as told by your health care provider. ? Elevate your feet for 15 minutes, 3-4 times a day. ? Limit salt in your diet. Safety  Wear your seat belt at all times when driving or riding in a car.  Talk with your health care provider if someone is verbally or physically abusive to you.  Talk with your health care provider if you are feeling sad or have thoughts of hurting yourself. Lifestyle  Do not use hot tubs, steam rooms, or  saunas.  Do not douche. Do not use tampons or scented sanitary pads.  Do not use herbal remedies, alcohol, illegal drugs, or medicines that are not approved by your health care provider. Chemicals in these products can harm your baby.  Do not use any products that contain nicotine or tobacco, such as cigarettes, e-cigarettes, and chewing tobacco. If you need help quitting, ask your health care provider.  Avoid cat litter boxes and soil used by cats. These carry germs that can cause birth defects in the baby and possibly loss of the unborn baby (fetus) by miscarriage or stillbirth. General instructions  During routine prenatal visits in the first trimester, your health care provider will do a physical exam, perform necessary tests, and ask you how things are going. Keep all follow-up visits. This is important.  Ask for help if you have counseling or nutritional needs during pregnancy. Your health care provider can offer advice or refer you to specialists for help with various needs.  Schedule a dentist appointment. At home, brush your teeth with a soft toothbrush. Floss gently.  Write down your questions. Take them to your prenatal visits. Where to find more information  American Pregnancy Association: americanpregnancy.org  American College of Obstetricians and Gynecologists: acog.org/en/Womens%20Health/Pregnancy  Office on Women's Health: womenshealth.gov/pregnancy Contact a health care provider if you have:  Dizziness.  A fever.  Mild pelvic cramps, pelvic pressure, or nagging pain in the abdominal area.  Nausea, vomiting, or diarrhea that lasts for 24 hours or longer.  A bad-smelling vaginal discharge.  Pain when you urinate.  Known exposure to a contagious illness, such as chickenpox, measles, Zika virus, HIV, or hepatitis. Get help right away if you have:  Spotting or bleeding from your vagina.  Severe abdominal cramping or pain.  Shortness of breath or chest  pain.  Any kind of trauma, such as from a fall or   a car crash.  New or increased pain, swelling, or redness in an arm or leg. Summary  The first trimester of pregnancy starts on the first day of your last menstrual period until the end of week 12 (months 1 through 3).  Eating 4 or 5 small meals a day rather than 3 large meals may help to relieve nausea and vomiting.  Do not use any products that contain nicotine or tobacco, such as cigarettes, e-cigarettes, and chewing tobacco. If you need help quitting, ask your health care provider.  Keep all follow-up visits. This is important. This information is not intended to replace advice given to you by your health care provider. Make sure you discuss any questions you have with your health care provider. Document Revised: 11/13/2019 Document Reviewed: 09/19/2019 Elsevier Patient Education  2021 Elsevier Inc.   

## 2020-09-16 LAB — MONITOR DRUG PROFILE 10(MW)
Amphetamine Scrn, Ur: NEGATIVE ng/mL
BARBITURATE SCREEN URINE: NEGATIVE ng/mL
BENZODIAZEPINE SCREEN, URINE: NEGATIVE ng/mL
CANNABINOIDS UR QL SCN: NEGATIVE ng/mL
Cocaine (Metab) Scrn, Ur: NEGATIVE ng/mL
Creatinine(Crt), U: 143.9 mg/dL (ref 20.0–300.0)
Methadone Screen, Urine: NEGATIVE ng/mL
OXYCODONE+OXYMORPHONE UR QL SCN: NEGATIVE ng/mL
Opiate Scrn, Ur: NEGATIVE ng/mL
Ph of Urine: 7.5 (ref 4.5–8.9)
Phencyclidine Qn, Ur: NEGATIVE ng/mL
Propoxyphene Scrn, Ur: NEGATIVE ng/mL

## 2020-09-17 LAB — URINE CYTOLOGY ANCILLARY ONLY
Chlamydia: NEGATIVE
Comment: NEGATIVE
Comment: NEGATIVE
Comment: NORMAL
Neisseria Gonorrhea: NEGATIVE
Trichomonas: NEGATIVE

## 2020-09-17 LAB — URINE CULTURE

## 2020-09-21 LAB — MATERNIT21 PLUS CORE+SCA
Fetal Fraction: 6
Monosomy X (Turner Syndrome): NOT DETECTED
Result (T21): NEGATIVE
Trisomy 13 (Patau syndrome): NEGATIVE
Trisomy 18 (Edwards syndrome): NEGATIVE
Trisomy 21 (Down syndrome): NEGATIVE
XXX (Triple X Syndrome): NOT DETECTED
XXY (Klinefelter Syndrome): NOT DETECTED
XYY (Jacobs Syndrome): NOT DETECTED

## 2020-09-21 LAB — RPR+RH+ABO+RUB AB+AB SCR+CB...
Antibody Screen: NEGATIVE
HIV Screen 4th Generation wRfx: NONREACTIVE
Hematocrit: 32.3 % — ABNORMAL LOW (ref 34.0–46.6)
Hemoglobin: 10.6 g/dL — ABNORMAL LOW (ref 11.1–15.9)
Hepatitis B Surface Ag: NEGATIVE
MCH: 27.3 pg (ref 26.6–33.0)
MCHC: 32.8 g/dL (ref 31.5–35.7)
MCV: 83 fL (ref 79–97)
Platelets: 366 10*3/uL (ref 150–450)
RBC: 3.88 x10E6/uL (ref 3.77–5.28)
RDW: 18.4 % — ABNORMAL HIGH (ref 11.7–15.4)
RPR Ser Ql: NONREACTIVE
Rh Factor: POSITIVE
Rubella Antibodies, IGG: 1.46 index (ref >0.99)
Varicella zoster IgG: 206 index (ref >165)
WBC: 7.8 10*3/uL (ref 3.4–10.8)

## 2020-09-21 LAB — HEPATITIS C ANTIBODY: Hep C Virus Ab: <0.1 s/co ratio (ref 0.0–0.9)

## 2020-09-29 ENCOUNTER — Ambulatory Visit (INDEPENDENT_AMBULATORY_CARE_PROVIDER_SITE_OTHER): Payer: Medicaid Other | Admitting: Obstetrics

## 2020-09-29 ENCOUNTER — Other Ambulatory Visit: Payer: Self-pay

## 2020-09-29 VITALS — BP 100/70 | Wt 190.0 lb

## 2020-09-29 DIAGNOSIS — Z3A10 10 weeks gestation of pregnancy: Secondary | ICD-10-CM

## 2020-09-29 DIAGNOSIS — Z3481 Encounter for supervision of other normal pregnancy, first trimester: Secondary | ICD-10-CM

## 2020-09-29 LAB — POCT URINALYSIS DIPSTICK OB
Glucose, UA: NEGATIVE
POC,PROTEIN,UA: NEGATIVE

## 2020-09-29 NOTE — Progress Notes (Signed)
  Routine Prenatal Care Visit  Subjective  Tayonna Bacha is a 25 y.o. G5P3003 at [redacted]w[redacted]d being seen today for ongoing prenatal care.  She is currently monitored for the following issues for this high-risk pregnancy and has BMI 33.0-33.9,adult and Supervision of normal pregnancy on their problem list.  ----------------------------------------------------------------------------------- Patient reports no complaints.    .  .   Pincus Large Fluid denies.  ----------------------------------------------------------------------------------- The following portions of the patient's history were reviewed and updated as appropriate: allergies, current medications, past family history, past medical history, past social history, past surgical history and problem list. Problem list updated.  Objective  Blood pressure 100/70, weight 190 lb (86.2 kg), last menstrual period 07/03/2020. Pregravid weight 197 lb (89.4 kg) Total Weight Gain -7 lb (-3.175 kg) Urinalysis: Urine Protein Negative  Urine Glucose Negative  Fetal Status:           General:  Alert, oriented and cooperative. Patient is in no acute distress.  Skin: Skin is warm and dry. No rash noted.   Cardiovascular: Normal heart rate noted  Respiratory: Normal respiratory effort, no problems with respiration noted  Abdomen: Soft, gravid, appropriate for gestational age.       Pelvic:  Cervical exam deferred        Extremities: Normal range of motion.     Mental Status: Normal mood and affect. Normal behavior. Normal judgment and thought content.   Assessment   25 y.o. G5P3003 at [redacted]w[redacted]d by  04/26/2021, by Ultrasound presenting for routine prenatal visit  Plan   pregnancy3 Problems (from 09/15/20 to present)    Problem Noted Resolved   Supervision of normal pregnancy 09/15/2020 by Natale Milch, MD No   Overview Addendum 09/15/2020  2:26 PM by Natale Milch, MD     Nursing Staff Provider  Office Location  [facility] Dating   6  wk [place]  Language  English Anatomy US    Flu Vaccine   Genetic Screen  NIPS:   TDaP vaccine    Hgb A1C or  GTT Early : Third trimester :   Covid    LAB RESULTS   Rhogam   Blood Type     Feeding Plan  Antibody    Contraception  Rubella    Circumcision  RPR     Pediatrician   HBsAg     Support Person  HIV    Prenatal Classes  Varicella     GBS  (For PCN allergy, check sensitivities)   BTL Consent     VBAC Consent  Pap  NIL 2021    Hgb Electro      CF      SMA               Previous Version       Preterm labor symptoms and general obstetric precautions including but not limited to vaginal bleeding, contractions, leaking of fluid and fetal movement were reviewed in detail with the patient. Please refer to After Visit Summary for other counseling recommendations.  Able to hear FHTs today.   Return in about 4 weeks (around 10/27/2020) for return OB.  Mirna Mires, CNM  09/29/2020 9:17 AM

## 2020-09-29 NOTE — Progress Notes (Signed)
ROB- no concerns 

## 2020-10-27 ENCOUNTER — Encounter: Payer: Self-pay | Admitting: Obstetrics and Gynecology

## 2020-10-27 ENCOUNTER — Other Ambulatory Visit: Payer: Self-pay

## 2020-10-27 ENCOUNTER — Ambulatory Visit (INDEPENDENT_AMBULATORY_CARE_PROVIDER_SITE_OTHER): Payer: Medicaid Other | Admitting: Obstetrics and Gynecology

## 2020-10-27 VITALS — BP 116/70 | Ht 61.0 in | Wt 187.2 lb

## 2020-10-27 DIAGNOSIS — Z3482 Encounter for supervision of other normal pregnancy, second trimester: Secondary | ICD-10-CM

## 2020-10-27 DIAGNOSIS — Z3689 Encounter for other specified antenatal screening: Secondary | ICD-10-CM

## 2020-10-27 DIAGNOSIS — Z3A14 14 weeks gestation of pregnancy: Secondary | ICD-10-CM

## 2020-10-27 DIAGNOSIS — O99012 Anemia complicating pregnancy, second trimester: Secondary | ICD-10-CM

## 2020-10-27 MED ORDER — FERROUS SULFATE 325 (65 FE) MG PO TABS: 325.0000 mg | ORAL_TABLET | ORAL | 1 refills | Status: AC

## 2020-10-27 NOTE — Progress Notes (Signed)
Routine Prenatal Care Visit  Subjective  Judith Beard is a 25 y.o. G5P3003 at [redacted]w[redacted]d being seen today for ongoing prenatal care.  She is currently monitored for the following issues for this low-risk pregnancy and has BMI 33.0-33.9,adult and Supervision of normal pregnancy on their problem list.  ----------------------------------------------------------------------------------- Patient reports no complaints.   Contractions: Not present. Vag. Bleeding: None.   . Denies leaking of fluid.  ----------------------------------------------------------------------------------- The following portions of the patient's history were reviewed and updated as appropriate: allergies, current medications, past family history, past medical history, past social history, past surgical history and problem list. Problem list updated.   Objective  Blood pressure 116/70, height 5\' 1"  (1.549 m), weight 187 lb 3.2 oz (84.9 kg), last menstrual period 07/03/2020. Pregravid weight 197 lb (89.4 kg) Total Weight Gain -9 lb 12.8 oz (-4.445 kg) Urinalysis:      Fetal Status: Fetal Heart Rate (bpm): 150         General:  Alert, oriented and cooperative. Patient is in no acute distress.  Skin: Skin is warm and dry. No rash noted.   Cardiovascular: Normal heart rate noted  Respiratory: Normal respiratory effort, no problems with respiration noted  Abdomen: Soft, gravid, appropriate for gestational age. Pain/Pressure: Absent     Pelvic:  Cervical exam deferred        Extremities: Normal range of motion.     ental Status: Normal mood and affect. Normal behavior. Normal judgment and thought content.    Assessment   25 y.o. G5P3003 at [redacted]w[redacted]d by  04/26/2021, by Ultrasound presenting for routine prenatal visit  Plan   pregnancy3 Problems (from 09/15/20 to present)    Problem Noted Resolved   Supervision of normal pregnancy 09/15/2020 by 09/17/2020, MD No   Overview Addendum 10/27/2020 10:42 AM by  12/27/2020, CNM     Nursing Staff Provider  Office Location  [facility] Dating   6 wk [place]  Language  English Anatomy US   ordered  Flu Vaccine   declined Genetic Screen  NIPS: neg x 3, XY  TDaP vaccine    Hgb A1C or  GTT Early : n/a Third trimester :   Covid  vax x 3   LAB RESULTS   Rhogam   n/a Blood Type   A+  Feeding Plan  breast and formula Antibody   Negative  Contraception  Rubella   Immune  Circumcision  RPR   Non-reactive  Pediatrician   HBsAg   Negative  Support Person  HIV   Non-reactive  Prenatal Classes  Varicella   Immune    GBS  (For PCN allergy, check sensitivities)   BTL Consent     VBAC Consent  n/a Pap  NIL 04/2020    Hgb Electro   n/a    CF      SMA               Previous Version      -Discussed fe supplementation - recommendation made for OTC fe supplementation, take every other day. Discussed possible SE. -Reviewed total 9lb weight loss for pregnancy - discussed daily nutrition intake - will monitor at next visit  Second trimester precautions including but not limited to vaginal bleeding, contractions, leaking of fluid and fetal movement were reviewed in detail with the patient.    Return in about 4 weeks (around 11/24/2020) for ROB - Anatomy scan order placed for [facility] in 4-5 weeks.  01/24/2021, CNM, MSN [facility], [facility] 10/27/2020, 10:42 AM

## 2020-11-24 ENCOUNTER — Ambulatory Visit (INDEPENDENT_AMBULATORY_CARE_PROVIDER_SITE_OTHER): Payer: Medicaid Other | Admitting: Advanced Practice Midwife

## 2020-11-24 ENCOUNTER — Encounter: Payer: Self-pay | Admitting: Advanced Practice Midwife

## 2020-11-24 ENCOUNTER — Other Ambulatory Visit: Payer: Self-pay

## 2020-11-24 VITALS — BP 120/70 | Wt 187.0 lb

## 2020-11-24 DIAGNOSIS — Z3482 Encounter for supervision of other normal pregnancy, second trimester: Secondary | ICD-10-CM

## 2020-11-24 DIAGNOSIS — Z3A18 18 weeks gestation of pregnancy: Secondary | ICD-10-CM

## 2020-11-24 LAB — POCT URINALYSIS DIPSTICK OB
Glucose, UA: NEGATIVE
POC,PROTEIN,UA: NEGATIVE

## 2020-11-24 NOTE — Progress Notes (Signed)
  Routine Prenatal Care Visit  Subjective  Judith Beard is a 25 y.o. G5P3003 at [redacted]w[redacted]d being seen today for ongoing prenatal care.  She is currently monitored for the following issues for this low-risk pregnancy and has BMI 33.0-33.9,adult and Supervision of normal pregnancy on their problem list.  ----------------------------------------------------------------------------------- Patient reports no complaints.   Contractions: Not present. Vag. Bleeding: None.  Movement: Present. Leaking Fluid denies.  ----------------------------------------------------------------------------------- The following portions of the patient's history were reviewed and updated as appropriate: allergies, current medications, past family history, past medical history, past social history, past surgical history and problem list. Problem list updated.  Objective  Blood pressure 120/70, weight 187 lb (84.8 kg), last menstrual period 07/03/2020. Pregravid weight 197 lb (89.4 kg) Total Weight Gain -10 lb (-4.536 kg) Urinalysis: Urine Protein    Urine Glucose    Fetal Status: Fetal Heart Rate (bpm): 143 Fundal Height: 18 cm Movement: Present     General:  Alert, oriented and cooperative. Patient is in no acute distress.  Skin: Skin is warm and dry. No rash noted.   Cardiovascular: Normal heart rate noted  Respiratory: Normal respiratory effort, no problems with respiration noted  Abdomen: Soft, gravid, appropriate for gestational age. Pain/Pressure: Absent     Pelvic:  Cervical exam deferred        Extremities: Normal range of motion.  Edema: None  Mental Status: Normal mood and affect. Normal behavior. Normal judgment and thought content.   Assessment   25 y.o. G5P3003 at [redacted]w[redacted]d by  04/26/2021, by Ultrasound presenting for routine prenatal visit  Plan   pregnancy3 Problems (from 09/15/20 to present)    Problem Noted Resolved   Supervision of normal pregnancy 09/15/2020 by Natale Milch, MD No    Overview Addendum 10/27/2020 10:42 AM by Zipporah Plants, CNM     Nursing Staff Provider  Office Location  [facility] Dating   6 wk [place]  Language  English Anatomy US   ordered  Flu Vaccine   declined Genetic Screen  NIPS: neg x 3, XY  TDaP vaccine    Hgb A1C or  GTT Early : n/a Third trimester :   Covid  vax x 3   LAB RESULTS   Rhogam   n/a Blood Type   A+  Feeding Plan  breast and formula Antibody   Negative  Contraception  Rubella   Immune  Circumcision  RPR   Non-reactive  Pediatrician   HBsAg   Negative  Support Person  HIV   Non-reactive  Prenatal Classes  Varicella   Immune    GBS  (For PCN allergy, check sensitivities)   BTL Consent     VBAC Consent  n/a Pap  NIL 04/2020    Hgb Electro   n/a    CF      SMA               Previous Version       Preterm labor symptoms and general obstetric precautions including but not limited to vaginal bleeding, contractions, leaking of fluid and fetal movement were reviewed in detail with the patient.    Return for scheduled appointments.  Tresea Mall, CNM 11/24/2020 9:17 AM

## 2020-11-24 NOTE — Addendum Note (Signed)
Addended by: Cornelius Moras D on: 11/24/2020 10:11 AM   Modules accepted: Orders

## 2020-12-08 ENCOUNTER — Ambulatory Visit
Admission: RE | Admit: 2020-12-08 | Discharge: 2020-12-08 | Disposition: A | Discharge status: Home / Self Care | Payer: Medicaid Other | Source: Ambulatory Visit | Attending: Obstetrics and Gynecology | Admitting: Obstetrics and Gynecology

## 2020-12-08 ENCOUNTER — Other Ambulatory Visit: Payer: Self-pay

## 2020-12-08 DIAGNOSIS — Z3689 Encounter for other specified antenatal screening: Secondary | ICD-10-CM | POA: Diagnosis not present

## 2020-12-09 ENCOUNTER — Ambulatory Visit (INDEPENDENT_AMBULATORY_CARE_PROVIDER_SITE_OTHER): Payer: Medicaid Other | Admitting: Advanced Practice Midwife

## 2020-12-09 ENCOUNTER — Encounter: Payer: Self-pay | Admitting: Advanced Practice Midwife

## 2020-12-09 VITALS — BP 110/72 | Wt 190.0 lb

## 2020-12-09 DIAGNOSIS — Z3A20 20 weeks gestation of pregnancy: Secondary | ICD-10-CM

## 2020-12-09 DIAGNOSIS — Z3482 Encounter for supervision of other normal pregnancy, second trimester: Secondary | ICD-10-CM

## 2020-12-09 LAB — POCT URINALYSIS DIPSTICK OB: Glucose, UA: NEGATIVE

## 2020-12-09 NOTE — Progress Notes (Signed)
  Routine Prenatal Care Visit  Subjective  Judith Beard is a 25 y.o. G5P3003 at [redacted]w[redacted]d being seen today for ongoing prenatal care.  She is currently monitored for the following issues for this low-risk pregnancy and has BMI 33.0-33.9,adult and Supervision of normal pregnancy on their problem list.  ----------------------------------------------------------------------------------- Patient reports no complaints.   Contractions: Not present. Vag. Bleeding: None.  Movement: Present. Leaking Fluid denies.  ----------------------------------------------------------------------------------- The following portions of the patient's history were reviewed and updated as appropriate: allergies, current medications, past family history, past medical history, past social history, past surgical history and problem list. Problem list updated.  Objective  Blood pressure 110/72, weight 190 lb (86.2 kg), last menstrual period 07/03/2020. Pregravid weight 197 lb (89.4 kg) Total Weight Gain -7 lb (-3.175 kg) Urinalysis: Urine Protein Trace  Urine Glucose Negative  Fetal Status: Fetal Heart Rate (bpm): 152 Fundal Height: 20 cm Movement: Present      Anatomy scan: complete, normal, cephalic, placenta fundal/posterior  General:  Alert, oriented and cooperative. Patient is in no acute distress.  Skin: Skin is warm and dry. No rash noted.   Cardiovascular: Normal heart rate noted  Respiratory: Normal respiratory effort, no problems with respiration noted  Abdomen: Soft, gravid, appropriate for gestational age. Pain/Pressure: Absent     Pelvic:  Cervical exam deferred        Extremities: Normal range of motion.  Edema: None  Mental Status: Normal mood and affect. Normal behavior. Normal judgment and thought content.   Assessment   25 y.o. G5P3003 at [redacted]w[redacted]d by  04/26/2021, by Ultrasound presenting for routine prenatal visit  Plan   pregnancy3 Problems (from 09/15/20 to present)    Problem Noted  Resolved   Supervision of normal pregnancy 09/15/2020 by Natale Milch, MD No   Overview Addendum 10/27/2020 10:42 AM by Zipporah Plants, CNM     Nursing Staff Provider  Office Location  [facility] Dating   6 wk [place]  Language  English Anatomy US   ordered  Flu Vaccine   declined Genetic Screen  NIPS: neg x 3, XY  TDaP vaccine    Hgb A1C or  GTT Early : n/a Third trimester :   Covid  vax x 3   LAB RESULTS   Rhogam   n/a Blood Type   A+  Feeding Plan  breast and formula Antibody   Negative  Contraception  Rubella   Immune  Circumcision  RPR   Non-reactive  Pediatrician   HBsAg   Negative  Support Person  HIV   Non-reactive  Prenatal Classes  Varicella   Immune    GBS  (For PCN allergy, check sensitivities)   BTL Consent     VBAC Consent  n/a Pap  NIL 04/2020    Hgb Electro   n/a    CF      SMA                    Preterm labor symptoms and general obstetric precautions including but not limited to vaginal bleeding, contractions, leaking of fluid and fetal movement were reviewed in detail with the patient.    Return in about 4 weeks (around 01/06/2021) for rob.  Tresea Mall, CNM 12/09/2020 4:16 PM

## 2020-12-09 NOTE — Progress Notes (Signed)
No complaints

## 2021-01-06 ENCOUNTER — Encounter: Payer: Self-pay | Admitting: Advanced Practice Midwife

## 2021-01-06 ENCOUNTER — Ambulatory Visit (INDEPENDENT_AMBULATORY_CARE_PROVIDER_SITE_OTHER): Payer: Medicaid Other | Admitting: Advanced Practice Midwife

## 2021-01-06 ENCOUNTER — Other Ambulatory Visit: Payer: Self-pay

## 2021-01-06 VITALS — BP 120/80 | Wt 194.0 lb

## 2021-01-06 DIAGNOSIS — Z130 Encounter for screening for diseases of the blood and blood-forming organs and certain disorders involving the immune mechanism: Secondary | ICD-10-CM

## 2021-01-06 DIAGNOSIS — Z3482 Encounter for supervision of other normal pregnancy, second trimester: Secondary | ICD-10-CM

## 2021-01-06 DIAGNOSIS — Z131 Encounter for screening for diabetes mellitus: Secondary | ICD-10-CM

## 2021-01-06 DIAGNOSIS — Z113 Encounter for screening for infections with a predominantly sexual mode of transmission: Secondary | ICD-10-CM

## 2021-01-06 DIAGNOSIS — Z3A24 24 weeks gestation of pregnancy: Secondary | ICD-10-CM

## 2021-01-06 DIAGNOSIS — Z369 Encounter for antenatal screening, unspecified: Secondary | ICD-10-CM

## 2021-01-06 LAB — POCT URINALYSIS DIPSTICK OB
Glucose, UA: NEGATIVE
POC,PROTEIN,UA: NEGATIVE

## 2021-01-06 NOTE — Progress Notes (Signed)
  Routine Prenatal Care Visit  Subjective  Judith Beard is a 25 y.o. G3P2002 at [redacted]w[redacted]d being seen today for ongoing prenatal care.  She is currently monitored for the following issues for this low-risk pregnancy and has BMI 33.0-33.9,adult and Supervision of normal pregnancy on their problem list.  ----------------------------------------------------------------------------------- Patient reports slight swelling of left ankle. She denies any pain at the site or in her left leg.   Contractions: Not present. Vag. Bleeding: None.  Movement: Present. Leaking Fluid denies.  ----------------------------------------------------------------------------------- The following portions of the patient's history were reviewed and updated as appropriate: allergies, current medications, past family history, past medical history, past social history, past surgical history and problem list. Problem list updated.  Objective  Blood pressure 120/80, weight 194 lb (88 kg), last menstrual period 07/03/2020. Pregravid weight 197 lb (89.4 kg) Total Weight Gain -3 lb (-1.361 kg) Urinalysis: Urine Protein    Urine Glucose    Fetal Status: Fetal Heart Rate (bpm): 132 Fundal Height: 24 cm Movement: Present     General:  Alert, oriented and cooperative. Patient is in no acute distress.  Skin: Skin is warm and dry. No rash noted.   Cardiovascular: Normal heart rate noted  Respiratory: Normal respiratory effort, no problems with respiration noted  Abdomen: Soft, gravid, appropriate for gestational age. Pain/Pressure: Absent     Pelvic:  Cervical exam deferred        Extremities: Normal range of motion.  Edema: Trace  Mental Status: Normal mood and affect. Normal behavior. Normal judgment and thought content.   Assessment   25 y.o. G3P2002 at [redacted]w[redacted]d by  04/26/2021, by Ultrasound presenting for routine prenatal visit  Plan   pregnancy3 Problems (from 09/15/20 to present)    Problem Noted Resolved   Supervision  of normal pregnancy 09/15/2020 by Natale Milch, MD No   Overview Addendum 10/27/2020 10:42 AM by Zipporah Plants, CNM     Nursing Staff Provider  Office Location  [facility] Dating   6 wk [place]  Language  English Anatomy US   ordered  Flu Vaccine   declined Genetic Screen  NIPS: neg x 3, XY  TDaP vaccine    Hgb A1C or  GTT Early : n/a Third trimester :   Covid  vax x 3   LAB RESULTS   Rhogam   n/a Blood Type   A+  Feeding Plan  breast and formula Antibody   Negative  Contraception  Rubella   Immune  Circumcision  RPR   Non-reactive  Pediatrician   HBsAg   Negative  Support Person  HIV   Non-reactive  Prenatal Classes  Varicella   Immune    GBS  (For PCN allergy, check sensitivities)   BTL Consent     VBAC Consent  n/a Pap  NIL 04/2020    Hgb Electro   n/a    CF      SMA                Swelling: elevate legs, increase hydration   Preterm labor symptoms and general obstetric precautions including but not limited to vaginal bleeding, contractions, leaking of fluid and fetal movement were reviewed in detail with the patient.    Return in about 4 weeks (around 02/03/2021) for 28 wk labs and rob.  Tresea Mall, CNM 01/06/2021 4:14 PM

## 2021-02-02 ENCOUNTER — Ambulatory Visit (INDEPENDENT_AMBULATORY_CARE_PROVIDER_SITE_OTHER): Payer: Medicaid Other | Admitting: Obstetrics and Gynecology

## 2021-02-02 ENCOUNTER — Other Ambulatory Visit: Payer: Medicaid Other

## 2021-02-02 ENCOUNTER — Encounter: Payer: Self-pay | Admitting: Obstetrics and Gynecology

## 2021-02-02 ENCOUNTER — Other Ambulatory Visit: Payer: Self-pay

## 2021-02-02 VITALS — BP 114/70 | Wt 197.2 lb

## 2021-02-02 DIAGNOSIS — Z3482 Encounter for supervision of other normal pregnancy, second trimester: Secondary | ICD-10-CM

## 2021-02-02 DIAGNOSIS — Z3A28 28 weeks gestation of pregnancy: Secondary | ICD-10-CM

## 2021-02-02 DIAGNOSIS — Z131 Encounter for screening for diabetes mellitus: Secondary | ICD-10-CM

## 2021-02-02 DIAGNOSIS — Z113 Encounter for screening for infections with a predominantly sexual mode of transmission: Secondary | ICD-10-CM

## 2021-02-02 DIAGNOSIS — Z369 Encounter for antenatal screening, unspecified: Secondary | ICD-10-CM

## 2021-02-02 DIAGNOSIS — Z3483 Encounter for supervision of other normal pregnancy, third trimester: Secondary | ICD-10-CM

## 2021-02-02 DIAGNOSIS — Z130 Encounter for screening for diseases of the blood and blood-forming organs and certain disorders involving the immune mechanism: Secondary | ICD-10-CM

## 2021-02-02 NOTE — Progress Notes (Signed)
    Routine Prenatal Care Visit  Subjective  Judith Beard is a 25 y.o. G3P2002 at [redacted]w[redacted]d being seen today for ongoing prenatal care.  She is currently monitored for the following issues for this low-risk pregnancy and has BMI 33.0-33.9,adult and Supervision of normal pregnancy on their problem list.  ----------------------------------------------------------------------------------- Patient reports no complaints.   Contractions: Not present. Vag. Bleeding: None.  Movement: Present. Denies leaking of fluid.  ----------------------------------------------------------------------------------- The following portions of the patient's history were reviewed and updated as appropriate: allergies, current medications, past family history, past medical history, past social history, past surgical history and problem list. Problem list updated.   Objective  Blood pressure 114/70, weight 197 lb 3.2 oz (89.4 kg), last menstrual period 07/03/2020. Pregravid weight 197 lb (89.4 kg) Total Weight Gain 3.2 oz (0.091 kg) Urinalysis:      Fetal Status: Fetal Heart Rate (bpm): 130   Movement: Present     General:  Alert, oriented and cooperative. Patient is in no acute distress.  Skin: Skin is warm and dry. No rash noted.   Cardiovascular: Normal heart rate noted  Respiratory: Normal respiratory effort, no problems with respiration noted  Abdomen: Soft, gravid, appropriate for gestational age. Pain/Pressure: Absent     Pelvic:  Cervical exam deferred        Extremities: Normal range of motion.  Edema: Trace  Mental Status: Normal mood and affect. Normal behavior. Normal judgment and thought content.     Assessment   25 y.o. G3P2002 at [redacted]w[redacted]d by  04/26/2021, by Ultrasound presenting for routine prenatal visit  Plan   pregnancy3 Problems (from 09/15/20 to present)     Problem Noted Resolved   Supervision of normal pregnancy 09/15/2020 by Natale Milch, MD No   Overview Addendum 02/02/2021   9:19 AM by Natale Milch, MD     Nursing Staff Provider  Office Location  [facility] Dating   6 wk [place]  Language  English Anatomy US  complete  Flu Vaccine   declined Genetic Screen  NIPS: neg x 3, XY  TDaP vaccine    Hgb A1C or  GTT Early : n/a Third trimester :   Covid  vax x 3   LAB RESULTS   Rhogam   n/a Blood Type   A+  Feeding Plan  breast and formula Antibody   Negative  Contraception Copper IUD Rubella   Immune  Circumcision  RPR   Non-reactive  Pediatrician   HBsAg   Negative  Support Person husband HIV   Non-reactive  Prenatal Classes  Varicella   Immune    GBS  (For PCN allergy, check sensitivities)   BTL Consent  n/a    VBAC Consent  n/a Pap  NIL 04/2020    Hgb Electro   n/a    CF      SMA                  28 week labs today Discussed contraception options- desires IUD  Gestational age appropriate obstetric precautions including but not limited to vaginal bleeding, contractions, leaking of fluid and fetal movement were reviewed in detail with the patient.    Return in about 2 weeks (around 02/16/2021) for ROB every 2 weeks for 4 visits.  Natale Milch MD [facility], [facility] 02/02/2021, 9:20 AM

## 2021-02-03 ENCOUNTER — Encounter: Payer: Self-pay | Admitting: Obstetrics and Gynecology

## 2021-02-03 LAB — 28 WEEK RH+PANEL
Basophils Absolute: 0 10*3/uL (ref 0.0–0.2)
Basos: 0 %
EOS (ABSOLUTE): 0.2 10*3/uL (ref 0.0–0.4)
Eos: 2 %
Gestational Diabetes Screen: 175 mg/dL — ABNORMAL HIGH (ref 65–139)
HIV Screen 4th Generation wRfx: NONREACTIVE
Hematocrit: 33.5 % — ABNORMAL LOW (ref 34.0–46.6)
Hemoglobin: 11.1 g/dL (ref 11.1–15.9)
Immature Grans (Abs): 0.1 10*3/uL (ref 0.0–0.1)
Immature Granulocytes: 1 %
Lymphocytes Absolute: 2 10*3/uL (ref 0.7–3.1)
Lymphs: 23 %
MCH: 32.6 pg (ref 26.6–33.0)
MCHC: 33.1 g/dL (ref 31.5–35.7)
MCV: 98 fL — ABNORMAL HIGH (ref 79–97)
Monocytes Absolute: 0.5 10*3/uL (ref 0.1–0.9)
Monocytes: 6 %
Neutrophils Absolute: 5.7 10*3/uL (ref 1.4–7.0)
Neutrophils: 68 %
Platelets: 322 10*3/uL (ref 150–450)
RBC: 3.41 x10E6/uL — ABNORMAL LOW (ref 3.77–5.28)
RDW: 13.7 % (ref 11.7–15.4)
RPR Ser Ql: NONREACTIVE
WBC: 8.4 10*3/uL (ref 3.4–10.8)

## 2021-02-04 ENCOUNTER — Other Ambulatory Visit: Payer: Self-pay | Admitting: Obstetrics

## 2021-02-04 DIAGNOSIS — Z3483 Encounter for supervision of other normal pregnancy, third trimester: Secondary | ICD-10-CM

## 2021-02-04 DIAGNOSIS — R7309 Other abnormal glucose: Secondary | ICD-10-CM

## 2021-02-04 NOTE — Progress Notes (Signed)
Glucose test

## 2021-02-15 ENCOUNTER — Other Ambulatory Visit: Payer: Medicaid Other

## 2021-02-15 ENCOUNTER — Other Ambulatory Visit: Payer: Self-pay

## 2021-02-15 DIAGNOSIS — Z3483 Encounter for supervision of other normal pregnancy, third trimester: Secondary | ICD-10-CM

## 2021-02-15 DIAGNOSIS — R7309 Other abnormal glucose: Secondary | ICD-10-CM

## 2021-02-16 ENCOUNTER — Ambulatory Visit (INDEPENDENT_AMBULATORY_CARE_PROVIDER_SITE_OTHER): Payer: Medicaid Other | Admitting: Obstetrics

## 2021-02-16 VITALS — BP 120/72 | Wt 200.0 lb

## 2021-02-16 DIAGNOSIS — Z3A30 30 weeks gestation of pregnancy: Secondary | ICD-10-CM

## 2021-02-16 DIAGNOSIS — Z3483 Encounter for supervision of other normal pregnancy, third trimester: Secondary | ICD-10-CM

## 2021-02-16 LAB — GESTATIONAL GLUCOSE TOLERANCE
Glucose, Fasting: 90 mg/dL (ref 65–94)
Glucose, GTT - 1 Hour: 157 mg/dL (ref 65–179)
Glucose, GTT - 2 Hour: 123 mg/dL (ref 65–154)
Glucose, GTT - 3 Hour: 114 mg/dL (ref 65–139)

## 2021-02-16 NOTE — Progress Notes (Signed)
ROB - no concerns. RM 4 °

## 2021-02-16 NOTE — Progress Notes (Signed)
  Routine Prenatal Care Visit  Subjective  Judith Beard is a 25 y.o. G3P2002 at [redacted]w[redacted]d being seen today for ongoing prenatal care.  She is currently monitored for the following issues for this low-risk pregnancy and has BMI 33.0-33.9,adult and Supervision of normal pregnancy on their problem list.  ----------------------------------------------------------------------------------- Patient reports no complaints.    .  .   Pincus Large Fluid denies.  ----------------------------------------------------------------------------------- The following portions of the patient's history were reviewed and updated as appropriate: allergies, current medications, past family history, past medical history, past social history, past surgical history and problem list. Problem list updated.  Objective  Blood pressure 120/72, weight 200 lb (90.7 kg), last menstrual period 07/03/2020. Pregravid weight 197 lb (89.4 kg) Total Weight Gain 3 lb (1.361 kg) Urinalysis: Urine Protein    Urine Glucose    Fetal Status:           General:  Alert, oriented and cooperative. Patient is in no acute distress.  Skin: Skin is warm and dry. No rash noted.   Cardiovascular: Normal heart rate noted  Respiratory: Normal respiratory effort, no problems with respiration noted  Abdomen: Soft, gravid, appropriate for gestational age.       Pelvic:  Cervical exam deferred        Extremities: Normal range of motion.     Mental Status: Normal mood and affect. Normal behavior. Normal judgment and thought content.   Assessment   25 y.o. G3P2002 at [redacted]w[redacted]d by  04/26/2021, by Ultrasound presenting for routine prenatal visit  Plan   pregnancy3 Problems (from 09/15/20 to present)    Problem Noted Resolved   Supervision of normal pregnancy 09/15/2020 by Natale Milch, MD No   Overview Addendum 02/04/2021  6:43 PM by Mirna Mires, CNM     Nursing Staff Provider  Office Location  [facility] Dating   6 wk [place]  Language   English Anatomy US  Complete female  Flu Vaccine   declined Genetic Screen  NIPS: neg x 3, XY  TDaP vaccine    Hgb A1C or  GTT Early : n/a Third trimester : 175. 3 hr test ordered  Covid  vax x 3   LAB RESULTS   Rhogam   n/a Blood Type   A+  Feeding Plan  breast and formula Antibody   Negative  Contraception Copper IUD Rubella   Immune  Circumcision  RPR   Non-reactive  Pediatrician   HBsAg   Negative  Support Person husband HIV   Non-reactive  Prenatal Classes  Varicella   Immune    GBS  (For PCN allergy, check sensitivities)   BTL Consent  n/a    VBAC Consent  n/a Pap  NIL 04/2020    Hgb Electro   n/a    CF      SMA                   Preterm labor symptoms and general obstetric precautions including but not limited to vaginal bleeding, contractions, leaking of fluid and fetal movement were reviewed in detail with the patient. Please refer to After Visit Summary for other counseling recommendations.   Return in about 2 weeks (around 03/02/2021) for return OB needs 3 hr GTT set up please!Mirna Mires, CNM  02/16/2021 11:48 AM

## 2021-03-02 ENCOUNTER — Ambulatory Visit (INDEPENDENT_AMBULATORY_CARE_PROVIDER_SITE_OTHER): Payer: Medicaid Other | Admitting: Advanced Practice Midwife

## 2021-03-02 ENCOUNTER — Other Ambulatory Visit: Payer: Self-pay

## 2021-03-02 ENCOUNTER — Encounter: Payer: Self-pay | Admitting: Advanced Practice Midwife

## 2021-03-02 VITALS — BP 110/70 | Ht 61.0 in | Wt 205.4 lb

## 2021-03-02 DIAGNOSIS — Z3A32 32 weeks gestation of pregnancy: Secondary | ICD-10-CM

## 2021-03-02 DIAGNOSIS — Z3483 Encounter for supervision of other normal pregnancy, third trimester: Secondary | ICD-10-CM

## 2021-03-02 NOTE — Progress Notes (Signed)
  Routine Prenatal Care Visit  Subjective  Judith Beard is a 25 y.o. G3P2002 at [redacted]w[redacted]d being seen today for ongoing prenatal care.  She is currently monitored for the following issues for this low-risk pregnancy and has BMI 33.0-33.9,adult and Supervision of normal pregnancy on their problem list.  ----------------------------------------------------------------------------------- Patient reports no complaints.   Contractions: Not present. Vag. Bleeding: None.  Movement: Present. Leaking Fluid denies.  ----------------------------------------------------------------------------------- The following portions of the patient's history were reviewed and updated as appropriate: allergies, current medications, past family history, past medical history, past social history, past surgical history and problem list. Problem list updated.  Objective  Blood pressure 110/70, height 5\' 1"  (1.549 m), weight 205 lb 6.4 oz (93.2 kg), last menstrual period 07/03/2020. Pregravid weight 197 lb (89.4 kg) Total Weight Gain 8 lb 6.4 oz (3.81 kg) Urinalysis: Urine Protein    Urine Glucose    Fetal Status: Fetal Heart Rate (bpm): 140 Fundal Height: 33 cm Movement: Present     General:  Alert, oriented and cooperative. Patient is in no acute distress.  Skin: Skin is warm and dry. No rash noted.   Cardiovascular: Normal heart rate noted  Respiratory: Normal respiratory effort, no problems with respiration noted  Abdomen: Soft, gravid, appropriate for gestational age. Pain/Pressure: Absent     Pelvic:  Cervical exam deferred        Extremities: Normal range of motion.  Edema: None  Mental Status: Normal mood and affect. Normal behavior. Normal judgment and thought content.   Assessment   25 y.o. G3P2002 at [redacted]w[redacted]d by  04/26/2021, by Ultrasound presenting for routine prenatal visit  Plan   pregnancy3 Problems (from 09/15/20 to present)    Problem Noted Resolved   Supervision of normal pregnancy 09/15/2020  by 09/17/2020, MD No   Overview Addendum 02/16/2021 12:03 PM by 02/18/2021, CNM     Nursing Staff Provider  Office Location  [facility] Dating   6 wk Mirna Mires  Language  English Anatomy US  complete  Flu Vaccine   declined Genetic Screen  NIPS: neg x 3, XY  TDaP vaccine    Hgb A1C or  GTT Early : n/a Third trimester : 175. 3 hr test ordered passed!  Covid  vax x 3   LAB RESULTS   Rhogam   n/a Blood Type   A+  Feeding Plan  breast and formula Antibody   Negative  Contraception Copper IUD Rubella   Immune  Circumcision  RPR   Non-reactive  Pediatrician   HBsAg   Negative  Support Person husband HIV   Non-reactive  Prenatal Classes  Varicella   Immune    GBS  (For PCN allergy, check sensitivities)   BTL Consent  n/a    VBAC Consent  n/a Pap  NIL 04/2020    Hgb Electro   n/a    CF      SMA                   Preterm labor symptoms and general obstetric precautions including but not limited to vaginal bleeding, contractions, leaking of fluid and fetal movement were reviewed in detail with the patient.    Return in about 2 weeks (around 03/16/2021) for rob x2.  03/18/2021, CNM 03/02/2021 9:30 AM

## 2021-03-16 ENCOUNTER — Ambulatory Visit (INDEPENDENT_AMBULATORY_CARE_PROVIDER_SITE_OTHER): Payer: Medicaid Other | Admitting: Obstetrics

## 2021-03-16 ENCOUNTER — Other Ambulatory Visit: Payer: Self-pay

## 2021-03-16 ENCOUNTER — Encounter: Payer: Medicaid Other | Admitting: Obstetrics

## 2021-03-16 VITALS — BP 120/70 | Ht 61.0 in | Wt 207.6 lb

## 2021-03-16 DIAGNOSIS — Z3483 Encounter for supervision of other normal pregnancy, third trimester: Secondary | ICD-10-CM

## 2021-03-16 DIAGNOSIS — Z3A34 34 weeks gestation of pregnancy: Secondary | ICD-10-CM

## 2021-03-16 LAB — POCT URINALYSIS DIPSTICK OB
Glucose, UA: NEGATIVE
POC,PROTEIN,UA: NEGATIVE

## 2021-03-16 NOTE — Progress Notes (Signed)
  Routine Prenatal Care Visit  Subjective  Judith Beard is a 25 y.o. G3P2002 at [redacted]w[redacted]d being seen today for ongoing prenatal care.  She is currently monitored for the following issues for this low-risk pregnancy and has Supervision of normal pregnancy on their problem list.  ----------------------------------------------------------------------------------- Patient reports no complaints.   Contractions: Not present. Vag. Bleeding: None.  Movement: Present. Leaking Fluid denies.  ----------------------------------------------------------------------------------- The following portions of the patient's history were reviewed and updated as appropriate: allergies, current medications, past family history, past medical history, past social history, past surgical history and problem list. Problem list updated.  Objective  Blood pressure 120/70, height 5\' 1"  (1.549 m), weight 207 lb 9.6 oz (94.2 kg), last menstrual period 07/03/2020. Pregravid weight 197 lb (89.4 kg) Total Weight Gain 10 lb 9.6 oz (4.808 kg) Urinalysis: Urine Protein    Urine Glucose    Fetal Status:     Movement: Present     General:  Alert, oriented and cooperative. Patient is in no acute distress.  Skin: Skin is warm and dry. No rash noted.   Cardiovascular: Normal heart rate noted  Respiratory: Normal respiratory effort, no problems with respiration noted  Abdomen: Soft, gravid, appropriate for gestational age. Pain/Pressure: Present     Pelvic:  Cervical exam deferred        Extremities: Normal range of motion.     Mental Status: Normal mood and affect. Normal behavior. Normal judgment and thought content.   Assessment   25 y.o. 30 at [redacted]w[redacted]d by  04/26/2021, by Ultrasound presenting for routine prenatal visit  Plan   pregnancy3 Problems (from 09/15/20 to present)    Problem Noted Resolved   Supervision of normal pregnancy 09/15/2020 by 09/17/2020, MD No   Overview Addendum 02/16/2021 12:03 PM by  02/18/2021, CNM     Nursing Staff Provider  Office Location  [facility] Dating   6 wk Mirna Mires  Language  English Anatomy US  complete  Flu Vaccine   declined Genetic Screen  NIPS: neg x 3, XY  TDaP vaccine    Hgb A1C or  GTT Early : n/a Third trimester : 175. 3 hr test ordered passed!  Covid  vax x 3   LAB RESULTS   Rhogam   n/a Blood Type   A+  Feeding Plan  breast and formula Antibody   Negative  Contraception Copper IUD Rubella   Immune  Circumcision  RPR   Non-reactive  Pediatrician   HBsAg   Negative  Support Person husband HIV   Non-reactive  Prenatal Classes  Varicella   Immune    GBS  (For PCN allergy, check sensitivities)   BTL Consent  n/a    VBAC Consent  n/a Pap  NIL 04/2020    Hgb Electro   n/a    CF      SMA                   Preterm labor symptoms and general obstetric precautions including but not limited to vaginal bleeding, contractions, leaking of fluid and fetal movement were reviewed in detail with the patient. Please refer to After Visit Summary for other counseling recommendations.   Return in about 2 weeks (around 03/30/2021) for return OB.  GBS next visit- order placed  05/30/2021, CNM  03/16/2021 9:45 AM

## 2021-03-30 ENCOUNTER — Encounter: Payer: Self-pay | Admitting: Advanced Practice Midwife

## 2021-03-30 ENCOUNTER — Other Ambulatory Visit (HOSPITAL_COMMUNITY)
Admission: RE | Admit: 2021-03-30 | Discharge: 2021-03-30 | Disposition: A | Discharge status: Home / Self Care | Payer: Medicaid Other | Source: Ambulatory Visit | Attending: Advanced Practice Midwife | Admitting: Advanced Practice Midwife

## 2021-03-30 ENCOUNTER — Ambulatory Visit (INDEPENDENT_AMBULATORY_CARE_PROVIDER_SITE_OTHER): Payer: Medicaid Other | Admitting: Advanced Practice Midwife

## 2021-03-30 ENCOUNTER — Other Ambulatory Visit: Payer: Self-pay

## 2021-03-30 VITALS — BP 110/66 | HR 96 | Wt 205.0 lb

## 2021-03-30 DIAGNOSIS — Z113 Encounter for screening for infections with a predominantly sexual mode of transmission: Secondary | ICD-10-CM | POA: Insufficient documentation

## 2021-03-30 DIAGNOSIS — Z369 Encounter for antenatal screening, unspecified: Secondary | ICD-10-CM | POA: Diagnosis present

## 2021-03-30 DIAGNOSIS — Z3A36 36 weeks gestation of pregnancy: Secondary | ICD-10-CM | POA: Insufficient documentation

## 2021-03-30 DIAGNOSIS — Z3483 Encounter for supervision of other normal pregnancy, third trimester: Secondary | ICD-10-CM | POA: Insufficient documentation

## 2021-03-30 DIAGNOSIS — Z3685 Encounter for antenatal screening for Streptococcus B: Secondary | ICD-10-CM

## 2021-03-30 NOTE — Progress Notes (Signed)
  Routine Prenatal Care Visit  Subjective  Judith Beard is a 25 y.o. G3P2002 at [redacted]w[redacted]d being seen today for ongoing prenatal care.  She is currently monitored for the following issues for this low-risk pregnancy and has Supervision of normal pregnancy on their problem list.  ----------------------------------------------------------------------------------- Patient reports pelvic pressure.   Contractions: Not present. Vag. Bleeding: None.  Movement: Present. Leaking Fluid denies.  ----------------------------------------------------------------------------------- The following portions of the patient's history were reviewed and updated as appropriate: allergies, current medications, past family history, past medical history, past social history, past surgical history and problem list. Problem list updated.  Objective  Blood pressure 110/66, pulse 96, weight 205 lb (93 kg), last menstrual period 07/03/2020. Pregravid weight 197 lb (89.4 kg) Total Weight Gain 8 lb (3.629 kg) Urinalysis: Urine Protein    Urine Glucose    Fetal Status: Fetal Heart Rate (bpm): 138 Fundal Height: 36 cm Movement: Present  Presentation: Vertex  General:  Alert, oriented and cooperative. Patient is in no acute distress.  Skin: Skin is warm and dry. No rash noted.   Cardiovascular: Normal heart rate noted  Respiratory: Normal respiratory effort, no problems with respiration noted  Abdomen: Soft, gravid, appropriate for gestational age. Pain/Pressure: Present     Pelvic:  GBS/aptima Dilation: 1 Effacement (%): 50 Station: -3  Extremities: Normal range of motion.  Edema: None  Mental Status: Normal mood and affect. Normal behavior. Normal judgment and thought content.   Assessment   25 y.o. A4Z6606 at [redacted]w[redacted]d by  04/26/2021, by Ultrasound presenting for routine prenatal visit  Plan   pregnancy3 Problems (from 09/15/20 to present)    Problem Noted Resolved   Supervision of normal pregnancy 09/15/2020 by  Natale Milch, MD No   Overview Addendum 02/16/2021 12:03 PM by Mirna Mires, CNM     Nursing Staff Provider  Office Location  [facility] Dating   6 wk [place]  Language  English Anatomy US  complete  Flu Vaccine   declined Genetic Screen  NIPS: neg x 3, XY  TDaP vaccine    Hgb A1C or  GTT Early : n/a Third trimester : 175. 3 hr test ordered passed!  Covid  vax x 3   LAB RESULTS   Rhogam   n/a Blood Type   A+  Feeding Plan  breast and formula Antibody   Negative  Contraception Copper IUD Rubella   Immune  Circumcision  RPR   Non-reactive  Pediatrician   HBsAg   Negative  Support Person husband HIV   Non-reactive  Prenatal Classes  Varicella   Immune    GBS  (For PCN allergy, check sensitivities)   BTL Consent  n/a    VBAC Consent  n/a Pap  NIL 04/2020    Hgb Electro   n/a    CF      SMA                   Preterm labor symptoms and general obstetric precautions including but not limited to vaginal bleeding, contractions, leaking of fluid and fetal movement were reviewed in detail with the patient.   Return in about 1 week (around 04/06/2021) for rob.  Tresea Mall, CNM 03/30/2021 9:55 AM

## 2021-03-30 NOTE — Progress Notes (Signed)
ROB - GBS, no concerns. RM 6 

## 2021-04-01 LAB — CERVICOVAGINAL ANCILLARY ONLY
Chlamydia: NEGATIVE
Comment: NEGATIVE
Comment: NEGATIVE
Comment: NORMAL
Neisseria Gonorrhea: NEGATIVE
Trichomonas: NEGATIVE

## 2021-04-03 LAB — STREP GP B CULTURE+RFLX: Strep Gp B Culture+Rflx: NEGATIVE

## 2021-04-07 ENCOUNTER — Ambulatory Visit (INDEPENDENT_AMBULATORY_CARE_PROVIDER_SITE_OTHER): Payer: Medicaid Other | Admitting: Advanced Practice Midwife

## 2021-04-07 ENCOUNTER — Other Ambulatory Visit: Payer: Self-pay

## 2021-04-07 ENCOUNTER — Encounter: Payer: Medicaid Other | Admitting: Advanced Practice Midwife

## 2021-04-07 ENCOUNTER — Encounter: Payer: Self-pay | Admitting: Advanced Practice Midwife

## 2021-04-07 VITALS — BP 123/78 | Wt 209.0 lb

## 2021-04-07 DIAGNOSIS — Z3A37 37 weeks gestation of pregnancy: Secondary | ICD-10-CM

## 2021-04-07 DIAGNOSIS — Z3483 Encounter for supervision of other normal pregnancy, third trimester: Secondary | ICD-10-CM

## 2021-04-07 LAB — POCT URINALYSIS DIPSTICK OB
Glucose, UA: NEGATIVE
POC,PROTEIN,UA: NEGATIVE

## 2021-04-07 NOTE — Progress Notes (Signed)
ROB - lots of vaginal pressure, just doesn't feel right, low back pain. RM 2

## 2021-04-07 NOTE — Progress Notes (Signed)
  Routine Prenatal Care Visit  Subjective  Judith Beard is a 25 y.o. G3P2002 at [redacted]w[redacted]d being seen today for ongoing prenatal care.  She is currently monitored for the following issues for this low-risk pregnancy and has Supervision of normal pregnancy on their problem list.  ----------------------------------------------------------------------------------- Patient reports constant low back pain and pelvic pressure since yesterday. She denies contractions.   Contractions: Not present.  .  Movement: Present. Leaking Fluid denies.  ----------------------------------------------------------------------------------- The following portions of the patient's history were reviewed and updated as appropriate: allergies, current medications, past family history, past medical history, past social history, past surgical history and problem list. Problem list updated.  Objective  Blood pressure 123/78, weight 209 lb (94.8 kg), last menstrual period 07/03/2020. Pregravid weight 197 lb (89.4 kg) Total Weight Gain 12 lb (5.443 kg) Urinalysis: Urine Protein Negative  Urine Glucose Negative  Fetal Status: Fetal Heart Rate (bpm): 132 Fundal Height: 38 cm Movement: Present  Presentation: Vertex  General:  Alert, oriented and cooperative. Patient is in no acute distress.  Skin: Skin is warm and dry. No rash noted.   Cardiovascular: Normal heart rate noted  Respiratory: Normal respiratory effort, no problems with respiration noted  Abdomen: Soft, gravid, appropriate for gestational age. Pain/Pressure: Present     Pelvic:  Cervical exam performed Dilation: 1 Effacement (%): 50 Station: -3  Extremities: Normal range of motion.  Edema: None  Mental Status: Normal mood and affect. Normal behavior. Normal judgment and thought content.   Assessment   25 y.o. G3P2002 at [redacted]w[redacted]d by  04/26/2021, by Ultrasound presenting for routine prenatal visit  Plan   pregnancy3 Problems (from 09/15/20 to present)     Problem Noted Resolved   Supervision of normal pregnancy 09/15/2020 by Natale Milch, MD No   Overview Addendum 04/03/2021  9:24 AM by Mirna Mires, CNM     Nursing Staff Provider  Office Location  [facility] Dating   6 wk [place]  Language  English Anatomy US  complete  Flu Vaccine   declined Genetic Screen  NIPS: neg x 3, XY  TDaP vaccine    Hgb A1C or  GTT Early : n/a Third trimester : 175. 3 hr test ordered passed!  Covid  vax x 3   LAB RESULTS   Rhogam   n/a Blood Type   A+  Feeding Plan  breast and formula Antibody   Negative  Contraception Copper IUD Rubella   Immune  Circumcision  RPR   Non-reactive  Pediatrician   HBsAg   Negative  Support Person husband HIV   Non-reactive  Prenatal Classes  Varicella   Immune    GBS  (For PCN allergy, check sensitivities) negative  BTL Consent  n/a    VBAC Consent  n/a Pap  NIL 04/2020    Hgb Electro   n/a    CF      SMA                   Term labor symptoms and general obstetric precautions including but not limited to vaginal bleeding, contractions, leaking of fluid and fetal movement were reviewed in detail with the patient. Please refer to After Visit Summary for other counseling recommendations.   Return in about 1 week (around 04/14/2021) for rob.  Tresea Mall, CNM 04/07/2021 10:34 AM

## 2021-04-08 ENCOUNTER — Encounter: Payer: Medicaid Other | Admitting: Advanced Practice Midwife

## 2021-04-14 ENCOUNTER — Other Ambulatory Visit: Payer: Self-pay

## 2021-04-14 ENCOUNTER — Ambulatory Visit (INDEPENDENT_AMBULATORY_CARE_PROVIDER_SITE_OTHER): Payer: Medicaid Other | Admitting: Advanced Practice Midwife

## 2021-04-14 ENCOUNTER — Encounter: Payer: Self-pay | Admitting: Advanced Practice Midwife

## 2021-04-14 VITALS — BP 120/80 | Wt 210.0 lb

## 2021-04-14 DIAGNOSIS — Z3483 Encounter for supervision of other normal pregnancy, third trimester: Secondary | ICD-10-CM

## 2021-04-14 DIAGNOSIS — Z3A38 38 weeks gestation of pregnancy: Secondary | ICD-10-CM

## 2021-04-14 NOTE — Progress Notes (Signed)
  Routine Prenatal Care Visit  Subjective  Diann Bangerter is a 25 y.o. G3P2002 at [redacted]w[redacted]d being seen today for ongoing prenatal care.  She is currently monitored for the following issues for this low-risk pregnancy and has Supervision of normal pregnancy on their problem list.  ----------------------------------------------------------------------------------- Patient reports general discomforts of 3rd trimester.   Contractions: Not present. Vag. Bleeding: None.  Movement: Present. Leaking Fluid denies.  ----------------------------------------------------------------------------------- The following portions of the patient's history were reviewed and updated as appropriate: allergies, current medications, past family history, past medical history, past social history, past surgical history and problem list. Problem list updated.  Objective  Blood pressure 120/80, weight 210 lb (95.3 kg), last menstrual period 07/03/2020. Pregravid weight 197 lb (89.4 kg) Total Weight Gain 13 lb (5.897 kg) Urinalysis: Urine Protein    Urine Glucose    Fetal Status: Fetal Heart Rate (bpm): 139 Fundal Height: 38 cm Movement: Present  Presentation: Vertex  General:  Alert, oriented and cooperative. Patient is in no acute distress.  Skin: Skin is warm and dry. No rash noted.   Cardiovascular: Normal heart rate noted  Respiratory: Normal respiratory effort, no problems with respiration noted  Abdomen: Soft, gravid, appropriate for gestational age. Pain/Pressure: Present     Pelvic:  Cervical exam performed Dilation: 1 Effacement (%): 50 Station: -3  Extremities: Normal range of motion.  Edema: None  Mental Status: Normal mood and affect. Normal behavior. Normal judgment and thought content.   Assessment   25 y.o. [id number] at [redacted]w[redacted]d by  04/26/2021, by Ultrasound presenting for routine prenatal visit  Plan   pregnancy3 Problems (from 09/15/20 to present)    Problem Noted Resolved   Supervision of normal  pregnancy 09/15/2020 by Natale Milch, MD No   Overview Addendum 04/03/2021  9:24 AM by Mirna Mires, CNM     Nursing Staff Provider  Office Location  [facility] Dating   6 wk [place]  Language  English Anatomy US  complete  Flu Vaccine   declined Genetic Screen  NIPS: neg x 3, XY  TDaP vaccine    Hgb A1C or  GTT Early : n/a Third trimester : 175. 3 hr test ordered passed!  Covid  vax x 3   LAB RESULTS   Rhogam   n/a Blood Type   A+  Feeding Plan  breast and formula Antibody   Negative  Contraception Copper IUD Rubella   Immune  Circumcision  RPR   Non-reactive  Pediatrician   HBsAg   Negative  Support Person husband HIV   Non-reactive  Prenatal Classes  Varicella   Immune    GBS  (For PCN allergy, check sensitivities) negative  BTL Consent  n/a    VBAC Consent  n/a Pap  NIL 04/2020    Hgb Electro   n/a    CF      SMA                   Term labor symptoms and general obstetric precautions including but not limited to vaginal bleeding, contractions, leaking of fluid and fetal movement were reviewed in detail with the patient.    Return in about 1 week (around 04/21/2021) for rob.  Tresea Mall, CNM 04/14/2021 11:50 AM

## 2021-04-21 ENCOUNTER — Other Ambulatory Visit: Payer: Self-pay

## 2021-04-21 ENCOUNTER — Encounter: Payer: Self-pay | Admitting: Obstetrics and Gynecology

## 2021-04-21 ENCOUNTER — Ambulatory Visit (INDEPENDENT_AMBULATORY_CARE_PROVIDER_SITE_OTHER): Payer: Medicaid Other | Admitting: Obstetrics and Gynecology

## 2021-04-21 VITALS — BP 120/70 | Ht 61.0 in | Wt 212.0 lb

## 2021-04-21 DIAGNOSIS — Z3483 Encounter for supervision of other normal pregnancy, third trimester: Secondary | ICD-10-CM

## 2021-04-21 DIAGNOSIS — Z3A39 39 weeks gestation of pregnancy: Secondary | ICD-10-CM

## 2021-04-21 LAB — POCT URINALYSIS DIPSTICK OB
Glucose, UA: NEGATIVE
POC,PROTEIN,UA: NEGATIVE

## 2021-04-21 NOTE — Progress Notes (Signed)
Routine Prenatal Care Visit  Subjective  Judith Beard is a 25 y.o. G3P2002 at [redacted]w[redacted]d being seen today for ongoing prenatal care.  She is currently monitored for the following issues for this low-risk pregnancy and has Supervision of normal pregnancy on their problem list.  ----------------------------------------------------------------------------------- Patient reports  she is very uncomfortable from pregnancy and has not been able to sleep much .   Contractions: Not present. Vag. Bleeding: None.  Movement: Present. Denies leaking of fluid.  ----------------------------------------------------------------------------------- The following portions of the patient's history were reviewed and updated as appropriate: allergies, current medications, past family history, past medical history, past social history, past surgical history and problem list. Problem list updated.   Objective  Blood pressure 120/70, height 5\' 1"  (1.549 m), weight 212 lb (96.2 kg), last menstrual period 07/03/2020. Pregravid weight 197 lb (89.4 kg) Total Weight Gain 15 lb (6.804 kg) Urinalysis:      Fetal Status: Fetal Heart Rate (bpm): 140 Fundal Height: 40 cm Movement: Present  Presentation: Vertex  General:  Alert, oriented and cooperative. Patient is in no acute distress.  Skin: Skin is warm and dry. No rash noted.   Cardiovascular: Normal heart rate noted  Respiratory: Normal respiratory effort, no problems with respiration noted  Abdomen: Soft, gravid, appropriate for gestational age. Pain/Pressure: Present     Pelvic:  Cervical exam performed Dilation: 1 Effacement (%): 50 Station: -3  Extremities: Normal range of motion.  Edema: None  Mental Status: Normal mood and affect. Normal behavior. Normal judgment and thought content.     Assessment   25 y.o. 30 at [redacted]w[redacted]d by  04/26/2021, by Ultrasound presenting for routine prenatal visit  Plan   pregnancy3 Problems (from 09/15/20 to present)      Problem Noted Resolved   Supervision of normal pregnancy 09/15/2020 by 09/17/2020, MD No   Overview Addendum 04/21/2021 11:50 AM by 13/07/2020, MD     Nursing Staff Provider  Office Location  [facility] Dating   6 wk Natale Milch  Language  English Anatomy US  complete  Flu Vaccine   declined Genetic Screen  NIPS: neg x 3, XY  TDaP vaccine    Hgb A1C or  GTT Early : n/a Third trimester : 175. 3 hr test ordered passed!  Covid  vax x 3   LAB RESULTS   Rhogam   n/a Blood Type   A+  Feeding Plan  breast and formula Antibody   Negative  Contraception Copper IUD Rubella   Immune  Circumcision  RPR   Non-reactive  Pediatrician   HBsAg   Negative  Support Person husband HIV   Non-reactive  Prenatal Classes  Varicella   Immune    GBS   negative  BTL Consent  n/a    VBAC Consent  n/a Pap  NIL 04/2020    Hgb Electro   n/a    CF      SMA                  Membrane sweep performed at maternal request. Patient like to schedule induction of labor.  However with calling labor and delivery the next available Is 05/01/2021.  They will return my call if they are able to accommodate an earlier induction later this afternoon.  Orders were placed in the computer for induction if it is able to be scheduled.  Gestational age appropriate obstetric precautions including but not limited to vaginal bleeding, contractions, leaking of fluid and fetal movement  were reviewed in detail with the patient.    Return in about 1 week (around 04/28/2021) for ROB in person.  Natale Milch MD [facility], [facility] 04/21/2021, 11:50 AM

## 2021-04-21 NOTE — Patient Instructions (Signed)
Labor Induction ?Labor induction is when steps are taken to cause a pregnant woman to begin the labor process. Most women go into labor on their own between 37 weeks and 42 weeks of pregnancy. When this does not happen, or when there is a medical need for labor to begin, steps may be taken to induce, or bring on, labor. ?Labor induction causes a pregnant woman's uterus to contract. It also causes the cervix to soften (ripen), open (dilate), and thin out. Usually, labor is not induced before 39 weeks of pregnancy unless there is a medical reason to do so. ?When is labor induction considered? ?Labor induction may be right for you if: ?Your pregnancy lasts longer than 41 to 42 weeks. ?Your placenta is separating from your uterus (placental abruption). ?You have a rupture of membranes and your labor does not begin. ?You have health problems, like diabetes or high blood pressure (preeclampsia) during your pregnancy. ?Your baby has stopped growing or does not have enough amniotic fluid. ?Before labor induction begins, your health care provider will consider the following factors: ?Your medical condition and the baby's condition. ?How many weeks you have been pregnant. ?How mature the baby's lungs are. ?The condition of your cervix. ?The position of the baby. ?The size of your birth canal. ?Tell a health care provider about: ?Any allergies you have. ?All medicines you are taking, including vitamins, herbs, eye drops, creams, and over-the-counter medicines. ?Any problems you or your family members have had with anesthetic medicines. ?Any surgeries you have had. ?Any blood disorders you have. ?Any medical conditions you have. ?What are the risks? ?Generally, this is a safe procedure. However, problems may occur, including: ?Failed induction. ?Changes in fetal heart rate, such as being too high, too low, or irregular (erratic). ?Infection in the mother or the baby. ?Increased risk of having a cesarean delivery. ?Breaking off  (abruption) of the placenta from the uterus. This is rare. ?Rupture of the uterus. This is very rare. ?Your baby could fail to get enough blood flow or oxygen. This can be life-threatening. ?When induction is needed for medical reasons, the benefits generally outweigh the risks. ?What happens during the procedure? ?During the procedure, your health care provider will use one of these methods to induce labor: ?Stripping the membranes. In this method, the amniotic sac tissue is gently separated from the cervix. This causes the following to happen: ?Your cervix stretches, which in turn causes the release of prostaglandins. ?Prostaglandins induce labor and cause the uterus to contract. ?This procedure is often done in an office visit. You will be sent home to wait for contractions to begin. ?Prostaglandin medicine. This medicine starts contractions and causes the cervix to dilate and ripen. This can be taken by mouth (orally) or by being inserted into the vagina (suppository). ?Inserting a small, thin tube (catheter) with a balloon into the vagina and then expanding the balloon with water to dilate the cervix. ?Breaking the water. In this method, a small instrument is used to make a small hole in the amniotic sac. This eventually causes the amniotic sac to break. Contractions should begin within a few hours. ?Medicine to trigger or strengthen contractions. This medicine is given through an IV that is inserted into a vein in your arm. ?This procedure may vary among health care providers and hospitals. ?Where to find more information ?March of Dimes: www.marchofdimes.org ?The American College of Obstetricians and Gynecologists: www.acog.org ?Summary ?Labor induction causes a pregnant woman's uterus to contract. It also causes the cervix   to soften (ripen), open (dilate), and thin out. ?Labor is usually not induced before 39 weeks of pregnancy unless there is a medical reason to do so. ?When induction is needed for medical  reasons, the benefits generally outweigh the risks. ?Talk with your health care provider about which methods of labor induction are right for you. ?This information is not intended to replace advice given to you by your health care provider. Make sure you discuss any questions you have with your health care provider. ?Document Revised: 03/19/2020 Document Reviewed: 03/19/2020 ?Elsevier Patient Education ? 2022 Elsevier Inc. ? ?

## 2021-04-22 ENCOUNTER — Other Ambulatory Visit: Payer: Self-pay | Admitting: Obstetrics & Gynecology

## 2021-04-22 ENCOUNTER — Telehealth: Payer: Self-pay

## 2021-04-22 NOTE — Telephone Encounter (Signed)
Pt aware IOL sched for 05/03/21.

## 2021-04-22 NOTE — Telephone Encounter (Signed)
Pt calling to confirm IOL has been scheduled.  [phone]  L&D doesn't have her down at all.

## 2021-04-25 ENCOUNTER — Inpatient Hospital Stay: Payer: Medicaid Other | Admitting: Anesthesiology

## 2021-04-25 ENCOUNTER — Inpatient Hospital Stay
Admission: EM | Admit: 2021-04-25 | Discharge: 2021-04-26 | DRG: 807 | Disposition: A | Discharge status: Home / Self Care | Payer: Medicaid Other | Attending: Obstetrics & Gynecology | Admitting: Obstetrics & Gynecology

## 2021-04-25 ENCOUNTER — Encounter: Payer: Self-pay | Admitting: Obstetrics and Gynecology

## 2021-04-25 ENCOUNTER — Other Ambulatory Visit: Payer: Self-pay

## 2021-04-25 DIAGNOSIS — Z3483 Encounter for supervision of other normal pregnancy, third trimester: Secondary | ICD-10-CM

## 2021-04-25 DIAGNOSIS — Z23 Encounter for immunization: Secondary | ICD-10-CM | POA: Diagnosis not present

## 2021-04-25 DIAGNOSIS — Z20822 Contact with and (suspected) exposure to covid-19: Secondary | ICD-10-CM | POA: Diagnosis present

## 2021-04-25 DIAGNOSIS — O4202 Full-term premature rupture of membranes, onset of labor within 24 hours of rupture: Secondary | ICD-10-CM | POA: Diagnosis not present

## 2021-04-25 DIAGNOSIS — Z3A40 40 weeks gestation of pregnancy: Secondary | ICD-10-CM

## 2021-04-25 DIAGNOSIS — Z3A39 39 weeks gestation of pregnancy: Secondary | ICD-10-CM

## 2021-04-25 DIAGNOSIS — O26893 Other specified pregnancy related conditions, third trimester: Secondary | ICD-10-CM | POA: Diagnosis present

## 2021-04-25 LAB — RAPID HIV SCREEN (HIV 1/2 AB+AG)
HIV 1/2 Antibodies: NONREACTIVE
HIV-1 P24 Antigen - HIV24: NONREACTIVE

## 2021-04-25 LAB — CBC
HCT: 31.6 % — ABNORMAL LOW (ref 36.0–46.0)
Hemoglobin: 10.4 g/dL — ABNORMAL LOW (ref 12.0–15.0)
MCH: 30.2 pg (ref 26.0–34.0)
MCHC: 32.9 g/dL (ref 30.0–36.0)
MCV: 91.9 fL (ref 80.0–100.0)
Platelets: 316 10*3/uL (ref 150–400)
RBC: 3.44 MIL/uL — ABNORMAL LOW (ref 3.87–5.11)
RDW: 13.9 % (ref 11.5–15.5)
WBC: 10.2 10*3/uL (ref 4.0–10.5)
nRBC: 0 % (ref 0.0–0.2)

## 2021-04-25 LAB — RESP PANEL BY RT-PCR (FLU A&B, COVID) ARPGX2
Influenza A by PCR: NEGATIVE
Influenza B by PCR: NEGATIVE
SARS Coronavirus 2 by RT PCR: NEGATIVE

## 2021-04-25 LAB — RPR: RPR Ser Ql: NONREACTIVE

## 2021-04-25 MED ORDER — WITCH HAZEL-GLYCERIN EX PADS: 1.0000 "application " | MEDICATED_PAD | CUTANEOUS | Status: DC | PRN

## 2021-04-25 MED ORDER — SODIUM CHLORIDE 0.9% FLUSH: 3.0000 mL | Freq: Two times a day (BID) | INTRAVENOUS | Status: DC

## 2021-04-25 MED ORDER — BUPIVACAINE HCL (PF) 0.25 % IJ SOLN
INTRAMUSCULAR | Status: DC | PRN
Start: 2021-04-25 — End: 2021-04-25
  Administered 2021-04-25: 8 mL via EPIDURAL

## 2021-04-25 MED ORDER — BENZOCAINE-MENTHOL 20-0.5 % EX AERO: 1.0000 "application " | INHALATION_SPRAY | CUTANEOUS | Status: DC | PRN

## 2021-04-25 MED ORDER — EPHEDRINE 5 MG/ML INJ: 10.0000 mg | INTRAVENOUS | Status: DC | PRN

## 2021-04-25 MED ORDER — LACTATED RINGERS IV SOLN: INTRAVENOUS | Status: DC

## 2021-04-25 MED ORDER — BUTORPHANOL TARTRATE 1 MG/ML IJ SOLN: 2.0000 mg | INTRAMUSCULAR | Status: DC | PRN

## 2021-04-25 MED ORDER — DIPHENHYDRAMINE HCL 25 MG PO CAPS: 25.0000 mg | ORAL_CAPSULE | Freq: Four times a day (QID) | ORAL | Status: DC | PRN

## 2021-04-25 MED ORDER — LIDOCAINE-EPINEPHRINE (PF) 1.5 %-1:200000 IJ SOLN
INTRAMUSCULAR | Status: DC | PRN
  Administered 2021-04-25: 3 mL via PERINEURAL

## 2021-04-25 MED ORDER — LIDOCAINE HCL (PF) 1 % IJ SOLN
INTRAMUSCULAR | Status: AC
  Filled 2021-04-25: qty 30

## 2021-04-25 MED ORDER — OXYTOCIN-SODIUM CHLORIDE 30-0.9 UT/500ML-% IV SOLN: 1.0000 m[IU]/min | INTRAVENOUS | Status: DC

## 2021-04-25 MED ORDER — MISOPROSTOL 25 MCG QUARTER TABLET: 25.0000 ug | ORAL_TABLET | ORAL | Status: DC | PRN

## 2021-04-25 MED ORDER — ZOLPIDEM TARTRATE 5 MG PO TABS: 5.0000 mg | ORAL_TABLET | Freq: Every evening | ORAL | Status: DC | PRN

## 2021-04-25 MED ORDER — LIDOCAINE HCL (PF) 1 % IJ SOLN: 30.0000 mL | INTRAMUSCULAR | Status: DC | PRN

## 2021-04-25 MED ORDER — ACETAMINOPHEN 325 MG PO TABS
650.0000 mg | ORAL_TABLET | ORAL | Status: DC | PRN
  Administered 2021-04-25 (×2): 650 mg via ORAL
  Filled 2021-04-25 (×2): qty 2

## 2021-04-25 MED ORDER — MISOPROSTOL 200 MCG PO TABS
ORAL_TABLET | ORAL | Status: AC
  Filled 2021-04-25: qty 4

## 2021-04-25 MED ORDER — IBUPROFEN 600 MG PO TABS
ORAL_TABLET | ORAL | Status: AC
  Administered 2021-04-25: 600 mg via ORAL
  Filled 2021-04-25: qty 1

## 2021-04-25 MED ORDER — DIBUCAINE (PERIANAL) 1 % EX OINT: 1.0000 "application " | TOPICAL_OINTMENT | CUTANEOUS | Status: DC | PRN

## 2021-04-25 MED ORDER — ACETAMINOPHEN 325 MG PO TABS: 650.0000 mg | ORAL_TABLET | ORAL | Status: DC | PRN

## 2021-04-25 MED ORDER — OXYTOCIN-SODIUM CHLORIDE 30-0.9 UT/500ML-% IV SOLN: 2.5000 [IU]/h | INTRAVENOUS | Status: DC

## 2021-04-25 MED ORDER — IBUPROFEN 600 MG PO TABS
600.0000 mg | ORAL_TABLET | Freq: Four times a day (QID) | ORAL | Status: DC
  Administered 2021-04-25 – 2021-04-26 (×3): 600 mg via ORAL
  Filled 2021-04-25 (×3): qty 1

## 2021-04-25 MED ORDER — FENTANYL-BUPIVACAINE-NACL 0.5-0.125-0.9 MG/250ML-% EP SOLN
EPIDURAL | Status: AC
  Filled 2021-04-25: qty 250

## 2021-04-25 MED ORDER — ONDANSETRON HCL 4 MG PO TABS
4.0000 mg | ORAL_TABLET | ORAL | Status: DC | PRN
  Filled 2021-04-25: qty 1

## 2021-04-25 MED ORDER — OXYCODONE-ACETAMINOPHEN 5-325 MG PO TABS: 1.0000 | ORAL_TABLET | ORAL | Status: DC | PRN

## 2021-04-25 MED ORDER — SENNOSIDES-DOCUSATE SODIUM 8.6-50 MG PO TABS
2.0000 | ORAL_TABLET | ORAL | Status: DC
  Administered 2021-04-25 – 2021-04-26 (×2): 2 via ORAL
  Filled 2021-04-25 (×2): qty 2

## 2021-04-25 MED ORDER — SODIUM CHLORIDE 0.9% FLUSH: 3.0000 mL | INTRAVENOUS | Status: DC | PRN

## 2021-04-25 MED ORDER — SIMETHICONE 80 MG PO CHEW: 80.0000 mg | CHEWABLE_TABLET | ORAL | Status: DC | PRN

## 2021-04-25 MED ORDER — SOD CITRATE-CITRIC ACID 500-334 MG/5ML PO SOLN: 30.0000 mL | ORAL | Status: DC | PRN

## 2021-04-25 MED ORDER — FENTANYL CITRATE (PF) 100 MCG/2ML IJ SOLN: 50.0000 ug | Freq: Once | INTRAMUSCULAR | Status: DC

## 2021-04-25 MED ORDER — FENTANYL-BUPIVACAINE-NACL 0.5-0.125-0.9 MG/250ML-% EP SOLN
12.0000 mL/h | EPIDURAL | Status: DC | PRN
  Administered 2021-04-25: 12 mL/h via EPIDURAL

## 2021-04-25 MED ORDER — PHENYLEPHRINE 40 MCG/ML (10ML) SYRINGE FOR IV PUSH (FOR BLOOD PRESSURE SUPPORT): 80.0000 ug | PREFILLED_SYRINGE | INTRAVENOUS | Status: DC | PRN

## 2021-04-25 MED ORDER — LACTATED RINGERS IV SOLN: 500.0000 mL | Freq: Once | INTRAVENOUS | Status: DC

## 2021-04-25 MED ORDER — ONDANSETRON HCL 4 MG/2ML IJ SOLN: 4.0000 mg | Freq: Four times a day (QID) | INTRAMUSCULAR | Status: DC | PRN

## 2021-04-25 MED ORDER — TERBUTALINE SULFATE 1 MG/ML IJ SOLN: 0.2500 mg | Freq: Once | INTRAMUSCULAR | Status: DC | PRN

## 2021-04-25 MED ORDER — OXYCODONE-ACETAMINOPHEN 5-325 MG PO TABS: 2.0000 | ORAL_TABLET | ORAL | Status: DC | PRN

## 2021-04-25 MED ORDER — AMMONIA AROMATIC IN INHA
RESPIRATORY_TRACT | Status: AC
  Filled 2021-04-25: qty 10

## 2021-04-25 MED ORDER — OXYTOCIN 10 UNIT/ML IJ SOLN
INTRAMUSCULAR | Status: AC
  Filled 2021-04-25: qty 2

## 2021-04-25 MED ORDER — OXYTOCIN-SODIUM CHLORIDE 30-0.9 UT/500ML-% IV SOLN
INTRAVENOUS | Status: AC
  Administered 2021-04-25: 2 m[IU]/min via INTRAVENOUS
  Filled 2021-04-25: qty 500

## 2021-04-25 MED ORDER — DIPHENHYDRAMINE HCL 50 MG/ML IJ SOLN: 12.5000 mg | INTRAMUSCULAR | Status: DC | PRN

## 2021-04-25 MED ORDER — LACTATED RINGERS IV SOLN
500.0000 mL | INTRAVENOUS | Status: DC | PRN
  Administered 2021-04-25: 500 mL via INTRAVENOUS

## 2021-04-25 MED ORDER — COCONUT OIL OIL
1.0000 "application " | TOPICAL_OIL | Status: DC | PRN
  Filled 2021-04-25: qty 120

## 2021-04-25 MED ORDER — SODIUM CHLORIDE 0.9 % IV SOLN: 250.0000 mL | INTRAVENOUS | Status: DC | PRN

## 2021-04-25 MED ORDER — ONDANSETRON HCL 4 MG/2ML IJ SOLN: 4.0000 mg | INTRAMUSCULAR | Status: DC | PRN

## 2021-04-25 MED ORDER — LIDOCAINE HCL (PF) 1 % IJ SOLN
INTRAMUSCULAR | Status: DC | PRN
  Administered 2021-04-25: 1 mL

## 2021-04-25 MED ORDER — OXYTOCIN BOLUS FROM INFUSION
333.0000 mL | Freq: Once | INTRAVENOUS | Status: AC
  Administered 2021-04-25: 333 mL via INTRAVENOUS

## 2021-04-25 MED ORDER — FENTANYL CITRATE (PF) 100 MCG/2ML IJ SOLN
INTRAMUSCULAR | Status: AC
  Administered 2021-04-25: 25 ug via EPIDURAL
  Filled 2021-04-25: qty 2

## 2021-04-25 NOTE — Discharge Summary (Signed)
Postpartum Discharge Summary  Date of Service updated11/12/2020     Patient Name: Judith Beard DOB: 1995/11/24 MRN: [id number]  Date of admission: 04/25/2021 Delivery date:04/25/2021  Delivering provider: Gae Dry  Date of discharge: 04/26/2021  Admitting diagnosis: [redacted] weeks gestation of pregnancy [Z3A.40] Intrauterine pregnancy: [redacted]w[redacted]d    Secondary diagnosis:  Principal Problem:   Normal labor and delivery Active Problems:   [redacted] weeks gestation of pregnancy   Postpartum care following vaginal delivery  Additional problems: none    Discharge diagnosis: Term Pregnancy Delivered                                              Post partum procedures: NA Augmentation: N/A Complications: None  Hospital course: Onset of Labor With Vaginal Delivery      25y.o. yo [id number] [id number] admitted in Active Labor on 04/25/2021. Patient had an uncomplicated labor course as follows:  Membrane Rupture Time/Date: 1:20 AM ,04/25/2021   Delivery Method:Vaginal, Spontaneous  Episiotomy: None  Lacerations:  None  Patient had an uncomplicated postpartum course.  She is ambulating, tolerating a regular diet, passing flatus, and urinating well. Patient is discharged home in stable condition on 04/26/21.  Newborn Data: Birth date:04/25/2021  Birth time:7:24 AM  Gender:Female  Living status:Living  Apgars:8 ,9  Weight:3240 g   Magnesium Sulfate received: No BMZ received: No Rhophylac:No MMR:No T-DaP:Given prenatally Flu: No Transfusion:No  Physical exam  Vitals:   04/25/21 1608 04/25/21 2110 04/25/21 2311 04/26/21 0429  BP: (!) 123/57 121/71 (!) 116/59 116/64  Pulse: 79 92 86 75  Resp: '16 18 20 20  ' Temp: 98.4 F (36.9 C) (!) 97.5 F (36.4 C) 98 F (36.7 C) 97.8 F (36.6 C)  TempSrc: Oral Oral Oral Oral  SpO2: 99% 100% 100% 99%  Weight:      Height:       General: alert, cooperative, and no distress Lochia: appropriate Uterine Fundus: firm Incision: N/A DVT  Evaluation: No evidence of DVT seen on physical exam. Negative Homan's sign. Labs: Lab Results  Component Value Date   WBC 10.6 (H) 04/26/2021   HGB 9.4 (L) 04/26/2021   HCT 29.0 (L) 04/26/2021   MCV 93.2 04/26/2021   PLT 280 04/26/2021   CMP Latest Ref Rng & Units 07/12/2017  Glucose 65 - 99 mg/dL 96  BUN 6 - 20 mg/dL 9  Creatinine 0.44 - 1.00 mg/dL 0.48  Sodium 135 - 145 mmol/L 135  Potassium 3.5 - 5.1 mmol/L 3.4(L)  Chloride 101 - 111 mmol/L 104  CO2 22 - 32 mmol/L 23  Calcium 8.9 - 10.3 mg/dL 9.0  Total Protein 6.5 - 8.1 g/dL 7.4  Total Bilirubin 0.3 - 1.2 mg/dL 0.4  Alkaline Phos 38 - 126 U/L 51  AST 15 - 41 U/L 20  ALT 14 - 54 U/L 17   Edinburgh Score: Edinburgh Postnatal Depression Scale Screening Tool 04/25/2021  I have been able to laugh and see the funny side of things. 0  I have looked forward with enjoyment to things. 0  I have blamed myself unnecessarily when things went wrong. 1  I have been anxious or worried for no good reason. 0  I have felt scared or panicky for no good reason. 0  Things have been getting on top of me. 0  I have been so unhappy  that I have had difficulty sleeping. 0  I have felt sad or miserable. 0  I have been so unhappy that I have been crying. 0  The thought of harming myself has occurred to me. 0  Edinburgh Postnatal Depression Scale Total 1      After visit meds:  Allergies as of 04/26/2021       Reactions   Penicillins Shortness Of Breath, Swelling   Penicillins Anaphylaxis   Latex Swelling        Medication List     TAKE these medications    ferrous sulfate 325 (65 FE) MG tablet Commonly known as: FerrouSul Take 1 tablet (325 mg total) by mouth every other day.   multivitamin-prenatal 27-0.8 MG Tabs tablet Take 1 tablet by mouth daily at 12 noon.         Discharge home in stable condition Infant Feeding: Breast Infant Disposition:home with mother Discharge instruction: per After Visit Summary and  Postpartum booklet. Activity: Advance as tolerated. Pelvic rest for 6 weeks.  Diet: routine diet Anticipated Birth Control: IUD Postpartum Appointment:6 weeks Additional Postpartum F/U:  none Future Appointments: Future Appointments  Date Time Provider [place]  04/30/2021 11:30 AM Rod Can, CNM [facility] None   Follow up Visit:  Follow-up Information     Gae Dry, MD. Schedule an appointment as soon as possible for a visit in 6 week(s).   Specialty: Obstetrics and Gynecology Why: For Post Partum Check Contact information: [address] [place] [address] [phone]                     04/26/2021 Imagene Riches, CNM

## 2021-04-25 NOTE — Progress Notes (Signed)
RN frequently adjusting [place] at the bedside from approximately 0457 to 0518.

## 2021-04-25 NOTE — H&P (Signed)
Obstetrics Admission History & Physical   No chief complaint on file.   HPI:  25 y.o. [id number] @ [redacted]w[redacted]d (04/26/2021, by Ultrasound). Admitted on 04/25/2021:   Patient Active Problem List   Diagnosis Date Noted   [redacted] weeks gestation of pregnancy 04/25/2021   Normal labor and delivery 04/25/2021   Supervision of normal pregnancy 09/15/2020     Presents for painful uterine contractions; also SROM at about 0130.   Prenatal care at: at [facility]. Pregnancy complicated by none.  ROS: A review of systems was performed and negative, except as stated in the above HPI. PMHx:  Past Medical History:  Diagnosis Date   Anemia    Obesity (BMI 30-39.9)    PSHx:  Past Surgical History:  Procedure Laterality Date   Mirena     NO PAST SURGERIES     Medications:  Medications Prior to Admission  Medication Sig Dispense Refill Last Dose   ferrous sulfate (FERROUSUL) 325 (65 FE) MG tablet Take 1 tablet (325 mg total) by mouth every other day. 60 tablet 1 Past Month   Prenatal Vit-Fe Fumarate-FA (MULTIVITAMIN-PRENATAL) 27-0.8 MG TABS tablet Take 1 tablet by mouth daily at 12 noon.   Past Week   Allergies: is allergic to penicillins, penicillins, and latex. OBHx:  OB History  Gravida Para Term Preterm AB Living  3 2 2  0 0 2  SAB IAB Ectopic Multiple Live Births  0 0 0   2    # Outcome Date GA Lbr Len/2nd Weight Sex Delivery Anes PTL Lv  3 Current           2 Term 02/23/18 [redacted]w[redacted]d / 02:43 3110 g F Vag-Spont EPI  LIV  1 Term 04/15/14 [redacted]w[redacted]d  3459 g M Vag-Spont   LIV     Birth Comments: IOL   [redacted]w[redacted]d except as detailed in HPI.POE:UMPNTIRW/[id number]  No family history of birth defects. Soc Hx: Alcohol: none and Recreational drug use: none  Objective:   Vitals:   04/25/21 0550 04/25/21 0707  BP:  118/73  Pulse:  86  Resp:  18  Temp:  97.7 F (36.5 C)  SpO2: 97%    Constitutional: Well nourished, well developed female in no acute distress.  HEENT: normal Skin: Warm and dry.   Cardiovascular:Regular rate and rhythm.   Extremity: trace to 1+ bilateral pedal edema Respiratory: Clear to auscultation bilateral. Normal respiratory effort Abdomen: gravid, ND, FHT present, moderate tenderness on exam Back: no CVAT Neuro: DTRs 2+, Cranial nerves grossly intact Psych: Alert and Oriented x3. No memory deficits. Normal mood and affect.  MS: normal gait, normal bilateral lower extremity ROM/strength/stability.  Pelvic exam: is not limited by body habitus EGBUS: within normal limits Vagina: within normal limits and with normal mucosa Cervix: initial exam- 3 cm, then 5 cm/80/-2 Uterus: Spontaneous uterine activity  Adnexa: not evaluated  EFM:FHR: 140 bpm, variability: moderate,  accelerations:  Present,  decelerations:  Absent Toco: Frequency: Every 3-4 minutes   Perinatal info:  Blood type: A positive Rubella- Immune Varicella -Immune TDaP Given during third trimester of this pregnancy RPR NR / HIV Neg/ HBsAg Neg   Assessment & Plan:   25 y.o. 30 @ [redacted]w[redacted]d, Admitted on 04/25/2021:Early Active Labor w SROM    Admit for labor, Observe for cervical change, Fetal Wellbeing Reassuring, and Epidural when ready  13/11/2020, MD, [facility] Major [facility], [facility] 04/25/2021  7:34 AM

## 2021-04-25 NOTE — Anesthesia Preprocedure Evaluation (Signed)
Anesthesia Evaluation  Patient identified by MRN, date of birth, ID band Patient awake    Reviewed: Allergy & Precautions, H&P , NPO status , Patient's Chart, lab work & pertinent test results  Airway Mallampati: III       Dental no notable dental hx.    Pulmonary neg pulmonary ROS,    Pulmonary exam normal        Cardiovascular Exercise Tolerance: Good negative cardio ROS Normal cardiovascular exam     Neuro/Psych negative neurological ROS  negative psych ROS   GI/Hepatic negative GI ROS, Neg liver ROS,   Endo/Other    Renal/GU   negative genitourinary   Musculoskeletal   Abdominal   Peds negative pediatric ROS (+)  Hematology negative hematology ROS (+) anemia ,   Anesthesia Other Findings G3P2  History reviewed. No pertinent past medical history.  History reviewed. No pertinent surgical history.  BMI    Body Mass Index:  37.63 kg/m      Reproductive/Obstetrics (+) Pregnancy                             Anesthesia Physical Anesthesia Plan  ASA: 2  Anesthesia Plan: Epidural   Post-op Pain Management:    Induction:   PONV Risk Score and Plan:   Airway Management Planned:   Additional Equipment:   Intra-op Plan:   Post-operative Plan:   Informed Consent: I have reviewed the patients History and Physical, chart, labs and discussed the procedure including the risks, benefits and alternatives for the proposed anesthesia with the patient or authorized representative who has indicated his/her understanding and acceptance.       Plan Discussed with:   Anesthesia Plan Comments:         Anesthesia Quick Evaluation

## 2021-04-25 NOTE — OB Triage Note (Signed)
Patient is a 25 yo, G3P2, at 39 weeks 6 days. Patient presents with complaints of leaking of fluid, vaginal bleeding, and contractions. Patient reports +FM. Monitors applied and assessing. VSS. Initial fetal heart tone 145. Will continue to monitor.

## 2021-04-25 NOTE — Anesthesia Procedure Notes (Signed)
Epidural  Start time: 04/25/2021 5:22 AM End time: 04/25/2021 5:50 AM  Staffing Anesthesiologist: Gwendolyn Fill, MD Performed: anesthesiologist   Preanesthetic Checklist Completed: patient identified, IV checked, site marked, risks and benefits discussed, surgical consent, monitors and equipment checked, pre-op evaluation and timeout performed  Epidural Patient position: sitting Prep: Betadine Patient monitoring: heart rate, cardiac monitor, continuous pulse ox and blood pressure Approach: midline Location: L2-L3 Injection technique: LOR air  Needle:  Needle gauge: 18 G Needle insertion depth: 4 cm Catheter size: 20 Guage Catheter at skin depth: 8 cm Test dose: negative and 1.5% lidocaine with Epi 1:200 K  Assessment Sensory level: T10  Additional Notes Reason for block:at surgeon's request and procedure for pain

## 2021-04-26 ENCOUNTER — Telehealth: Payer: Self-pay

## 2021-04-26 LAB — CBC
HCT: 29 % — ABNORMAL LOW (ref 36.0–46.0)
Hemoglobin: 9.4 g/dL — ABNORMAL LOW (ref 12.0–15.0)
MCH: 30.2 pg (ref 26.0–34.0)
MCHC: 32.4 g/dL (ref 30.0–36.0)
MCV: 93.2 fL (ref 80.0–100.0)
Platelets: 280 10*3/uL (ref 150–400)
RBC: 3.11 MIL/uL — ABNORMAL LOW (ref 3.87–5.11)
RDW: 14.3 % (ref 11.5–15.5)
WBC: 10.6 10*3/uL — ABNORMAL HIGH (ref 4.0–10.5)
nRBC: 0 % (ref 0.0–0.2)

## 2021-04-26 MED ORDER — INFLUENZA VAC SPLIT QUAD 0.5 ML IM SUSY
0.5000 mL | PREFILLED_SYRINGE | INTRAMUSCULAR | Status: AC | PRN
  Administered 2021-04-26: 0.5 mL via INTRAMUSCULAR
  Filled 2021-04-26: qty 0.5

## 2021-04-26 MED ORDER — INFLUENZA VAC SPLIT QUAD 0.5 ML IM SUSY: 0.5000 mL | PREFILLED_SYRINGE | INTRAMUSCULAR | Status: DC

## 2021-04-26 NOTE — Telephone Encounter (Signed)
Patient is scheduled for 06/09/21 at 2:30 with [facility] for 6 week pp and mirena placement

## 2021-04-26 NOTE — Progress Notes (Signed)
Pt called out to this RN with request to give infant formula. Pt educated about giving formula and the benefits of nursing. Pt states that "I just want some sleep" Pt given formula in order to have a respite.

## 2021-04-26 NOTE — Lactation Note (Signed)
This note was copied from a baby's chart. Lactation Consultation Note  Patient Name: Judith Beard [id number] Date: 04/26/2021   Age:26 hours  Lactation in for initial visit. Mom and baby both asleep in the bed. LC moved baby to bassinet, mom barely woke- attempted to educate on safe sleep, mom rolled over in bed.   Danford Bad 04/26/2021, 8:53 AM

## 2021-04-26 NOTE — Progress Notes (Signed)
Pt discharged with infant.  Discharge instructions, prescriptions and follow up appointment given to and reviewed with pt. Pt verbalized understanding. Escorted out by auxillary. 

## 2021-04-26 NOTE — Anesthesia Postprocedure Evaluation (Signed)
Anesthesia Post Note  Patient: Judith Beard  Procedure(s) Performed: AN AD HOC LABOR EPIDURAL  Patient location during evaluation: Mother Baby Anesthesia Type: Epidural Level of consciousness: awake, awake and alert, oriented and patient cooperative Pain management: pain level controlled Vital Signs Assessment: post-procedure vital signs reviewed and stable Respiratory status: spontaneous breathing, nonlabored ventilation and respiratory function stable Cardiovascular status: stable Postop Assessment: no headache, no backache, patient able to bend at knees, no apparent nausea or vomiting and able to ambulate Anesthetic complications: no   No notable events documented.   Last Vitals:  Vitals:   04/25/21 2311 04/26/21 0429  BP: (!) 116/59 116/64  Pulse: 86 75  Resp: 20 20  Temp: 36.7 C 36.6 C  SpO2: 100% 99%    Last Pain:  Vitals:   04/26/21 0429  TempSrc: Oral  PainSc:                  Lyn Records

## 2021-04-27 NOTE — Telephone Encounter (Signed)
Noted. Will order to arrive by apt date/time. 

## 2021-04-28 LAB — TYPE AND SCREEN
ABO/RH(D): A POS
Antibody Screen: NEGATIVE

## 2021-04-30 ENCOUNTER — Encounter: Payer: Medicaid Other | Admitting: Advanced Practice Midwife

## 2021-06-09 ENCOUNTER — Encounter: Payer: Self-pay | Admitting: Obstetrics & Gynecology

## 2021-06-09 ENCOUNTER — Other Ambulatory Visit (HOSPITAL_COMMUNITY)
Admission: RE | Admit: 2021-06-09 | Discharge: 2021-06-09 | Disposition: A | Discharge status: Home / Self Care | Payer: Medicaid Other | Source: Ambulatory Visit | Attending: Obstetrics & Gynecology | Admitting: Obstetrics & Gynecology

## 2021-06-09 ENCOUNTER — Other Ambulatory Visit: Payer: Self-pay

## 2021-06-09 ENCOUNTER — Ambulatory Visit (INDEPENDENT_AMBULATORY_CARE_PROVIDER_SITE_OTHER): Payer: Medicaid Other | Admitting: Obstetrics & Gynecology

## 2021-06-09 DIAGNOSIS — Z124 Encounter for screening for malignant neoplasm of cervix: Secondary | ICD-10-CM | POA: Diagnosis present

## 2021-06-09 DIAGNOSIS — Z30430 Encounter for insertion of intrauterine contraceptive device: Secondary | ICD-10-CM | POA: Diagnosis not present

## 2021-06-09 NOTE — Patient Instructions (Signed)
Intrauterine Device Insertion, Care After This sheet gives you information about how to care for yourself after your procedure. Your health care provider may also give you more specific instructions. If you have problems or questions, contact your health care provider. What can I expect after the procedure? After the procedure, it is common to have: Cramps and pain in the abdomen. Bleeding. It may be light or heavy. This may last for a few days. Lower back pain. Dizziness. Headaches. Nausea. Follow these instructions at home:  Before resuming sexual activity, check to make sure that you can feel the IUD string or strings. You should be able to feel the end of the string below the opening of your cervix. If your IUD string is in place, you may resume sexual activity. If you had a hormonal IUD inserted more than 7 days after your most recent period started, you will need to use a backup method of birth control for 7 days after IUD insertion. Ask your health care provider whether this applies to you. Continue to check that the IUD is still in place by feeling for the strings after every menstrual period, or once a month. An IUD will not protect you from sexually transmitted infections (STIs). Use methods to prevent the exchange of body fluids between partners (barrier protection) every time you have sex. Barrier protection can be used during oral, vaginal, or anal sex. Commonly used barrier methods include: Female condom. Female condom. Dental dam. Take over-the-counter and prescription medicines only as told by your health care provider. Keep all follow-up visits as told by your health care provider. This is important. Contact a health care provider if: You feel light-headed or weak. You have any of the following problems with your IUD string or strings: The string bothers or hurts you or your sexual partner. You cannot feel the string. The string has gotten longer. You can feel the IUD in  your vagina. You think you may be pregnant, or you miss your menstrual period. You think you may have a sexually transmitted infection (STI). Get help right away if: You have flu-like symptoms, such as tiredness (fatigue) and muscle aches. You have a fever and chills. You have bleeding that is heavier or lasts longer than a normal menstrual cycle. You have abnormal or bad-smelling discharge from your vagina. You develop abdominal pain that is new, is getting worse, or is not in the same area of earlier cramping and pain. You have pain during sexual activity. Summary After the procedure, it is common to have cramps and pain in the abdomen. It is also common to have light bleeding or heavier bleeding that is like your menstrual period. Continue to check that the IUD is still in place by feeling for the strings after every menstrual period, or once a month. Keep all follow-up visits as told by your health care provider. This is important. Contact your health care provider if you have problems with your IUD strings, such as the string getting longer or bothering you or your sexual partner. This information is not intended to replace advice given to you by your health care provider. Make sure you discuss any questions you have with your health care provider. Document Revised: 05/28/2019 Document Reviewed: 05/28/2019 Elsevier Patient Education  2022 Elsevier Inc.  

## 2021-06-09 NOTE — Progress Notes (Signed)
°  OBSTETRICS POSTPARTUM CLINIC PROGRESS NOTE  Subjective:     Judith Beard is a 25 y.o. [id number] female who presents for a postpartum visit. She is 6 weeks postpartum following a Term pregnancy, Single fetus, or Uncomplicated pregnancy and delivery by Vaginal, no problems at delivery.  I have fully reviewed the prenatal and intrapartum course. Anesthesia: epidural.  Postpartum course has been complicated by uncomplicated.  Baby is feeding by Breast.  Bleeding: patient has not  resumed menses.  Bowel function is normal. Bladder function is normal.  Patient is not sexually active. Contraception method desired is IUD.  Postpartum depression screening: negative. Edinburgh 4.  The following portions of the patient's history were reviewed and updated as appropriate: allergies, current medications, past family history, past medical history, past social history, past surgical history, and problem list.  Review of Systems Pertinent items are noted in HPI.  Objective:    BP 126/84    Ht 5\' 1"  (1.549 m)    Wt 179 lb (81.2 kg)    Breastfeeding Yes    BMI 33.82 kg/m   General:  alert and no distress   Breasts:  inspection negative, no nipple discharge or bleeding, no masses or nodularity palpable  Lungs: clear to auscultation bilaterally  Heart:  regular rate and rhythm, S1, S2 normal, no murmur, click, rub or gallop  Abdomen: soft, non-tender; bowel sounds normal; no masses,  no organomegaly.     Vulva:  normal  Vagina: normal vagina, no discharge, exudate, lesion, or erythema  Cervix:  no cervical motion tenderness and no lesions  Corpus: normal size, contour, position, consistency, mobility, non-tender  Adnexa:  normal adnexa and no mass, fullness, tenderness  Rectal Exam: Not performed.         Assessment:  Post Partum Care visit 1. Postpartum care following vaginal delivery  2. Screening for malignant neoplasm of cervix - Cytology - PAP  3. Encounter for insertion of  intrauterine contraceptive device (IUD) See below   Plan:  See orders and Patient Instructions Resume all normal activities Follow up in: 4 weeks or as needed.     IUD PROCEDURE NOTE:  Judith Beard is a 25 y.o. [phone] here for IUD insertion. No GYN concerns.  Last pap smear was normal.  IUD Insertion Procedure Note Patient identified, informed consent performed, consent signed.   Discussed risks of irregular bleeding, cramping, infection, malpositioning or misplacement of the IUD outside the uterus which may require further procedure such as laparoscopy, risk of failure <1%. Time out was performed.  Urine pregnancy test negative.  A bimanual exam showed the uterus to be midposition.  Speculum placed in the vagina.  Cervix visualized.  Cleaned with Betadine x 2.  Grasped anteriorly with a single tooth tenaculum.  Uterus sounded to 7 cm.   Mirena IUD placed per manufacturer's recommendations.  Strings trimmed to 3 cm. Tenaculum was removed, good hemostasis noted.  Patient tolerated procedure well.   Patient was given post-procedure instructions.  She was advised to have backup contraception for one week.  Patient was also asked to check IUD strings periodically and follow up in 4 weeks for IUD check.  [id number], MD, Annam[facility] Major Ob/Gyn, [facility] 06/09/2021  2:57 PM

## 2021-06-10 NOTE — Telephone Encounter (Signed)
Mirena rcvd/charged 06/09/2021

## 2021-06-15 LAB — CYTOLOGY - PAP: Diagnosis: NEGATIVE

## 2021-07-01 ENCOUNTER — Encounter: Payer: Self-pay | Admitting: Obstetrics and Gynecology

## 2021-07-01 ENCOUNTER — Ambulatory Visit (INDEPENDENT_AMBULATORY_CARE_PROVIDER_SITE_OTHER): Payer: Medicaid Other | Admitting: Obstetrics and Gynecology

## 2021-07-01 ENCOUNTER — Other Ambulatory Visit: Payer: Self-pay

## 2021-07-01 ENCOUNTER — Ambulatory Visit: Payer: Medicaid Other | Admitting: Obstetrics and Gynecology

## 2021-07-01 VITALS — BP 120/64 | Ht 61.0 in | Wt 178.0 lb

## 2021-07-01 DIAGNOSIS — Z30431 Encounter for routine checking of intrauterine contraceptive device: Secondary | ICD-10-CM | POA: Diagnosis not present

## 2021-07-01 DIAGNOSIS — N941 Unspecified dyspareunia: Secondary | ICD-10-CM | POA: Diagnosis not present

## 2021-07-01 DIAGNOSIS — Z975 Presence of (intrauterine) contraceptive device: Secondary | ICD-10-CM

## 2021-07-01 DIAGNOSIS — N921 Excessive and frequent menstruation with irregular cycle: Secondary | ICD-10-CM | POA: Diagnosis not present

## 2021-07-01 DIAGNOSIS — N898 Other specified noninflammatory disorders of vagina: Secondary | ICD-10-CM | POA: Diagnosis not present

## 2021-07-01 LAB — POCT WET PREP WITH KOH
Clue Cells Wet Prep HPF POC: NEGATIVE
KOH Prep POC: NEGATIVE
Trichomonas, UA: NEGATIVE
Yeast Wet Prep HPF POC: NEGATIVE

## 2021-07-01 LAB — POCT URINE PREGNANCY: Preg Test, Ur: NEGATIVE

## 2021-07-01 NOTE — Progress Notes (Signed)
Default, Provider, MD   Chief Complaint  Patient presents with   Vaginal Bleeding    Light bleeding since IUD insertion, no abnormal pain. Pain during intercourse    HPI:      Judith Beard is a 26 y.o. [phone] whose LMP was No LMP recorded. (Menstrual status: IUD)., presents today for daily light bleeding since Mirena placed PP 06/09/21, no pain/dysmen. Sx increase with activity but still very light. Blood is usually red vs brown d/c and doesn't last all day. Pt is pumping Q3 hrs, including at night. She also noted vaginal dryness with sex recently. Afterwards, noticed swelling of labia with burning. No lubricants used. Sx resolved spontaneously. Hasn't had sx before PP. No increased vag d/c or odor. No new sexual partners. No urin sx.   Patient Active Problem List   Diagnosis Date Noted   Postpartum care following vaginal delivery 04/26/2021   [redacted] weeks gestation of pregnancy 04/25/2021   Normal labor and delivery 04/25/2021   Supervision of normal pregnancy 09/15/2020    Past Surgical History:  Procedure Laterality Date   Mirena      Family History  Problem Relation Age of Onset   Hypertension Maternal Grandfather     Social History   Socioeconomic History   Marital status: Single    Spouse name: Not on file   Number of children: 1   Years of education: 12   Highest education level: Not on file  Occupational History   Occupation: Scientist, 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    Comment: Mirena  Other Topics Concern   Not on file  Social History Narrative   ** Merged History Encounter **       Social Determinants of Health   Financial Resource Strain: Not on file  Food Insecurity: Not  on file  Transportation Needs: Not on file  Physical Activity: Not on file  Stress: Not on file  Social Connections: Not on file  Intimate Partner Violence: Not on file    Outpatient Medications Prior to Visit  Medication Sig Dispense Refill   levonorgestrel (MIRENA) 20 MCG/DAY IUD 1 each by Intrauterine route once.     ferrous sulfate (FERROUSUL) 325 (65 FE) MG tablet Take 1 tablet (325 mg total) by mouth every other day. (Patient not taking: Reported on 07/01/2021) 60 tablet 1   Prenatal Vit-Fe Fumarate-FA (MULTIVITAMIN-PRENATAL) 27-0.8 MG TABS tablet Take 1 tablet by mouth daily at 12 noon.     No facility-administered medications prior to visit.      ROS:  Review of Systems  Constitutional:  Negative for fever.  Gastrointestinal:  Negative for blood in stool, constipation, diarrhea, nausea and vomiting.  Genitourinary:  Positive for dyspareunia and vaginal bleeding. Negative for dysuria, flank pain, frequency, hematuria, urgency, vaginal discharge and vaginal pain.  Musculoskeletal:  Negative for back pain.  Skin:  Negative for rash.  BREAST: No symptoms   OBJECTIVE:   Vitals:  BP 120/64    Ht 5\' 1"  (1.549 m)    Wt 178 lb (80.7 kg)    Breastfeeding Yes    BMI 33.63 kg/m   Physical Exam Vitals reviewed.  Constitutional:  Appearance: She is well-developed.  Pulmonary:     Effort: Pulmonary effort is normal.  Genitourinary:    General: Normal vulva.     Pubic Area: No rash.      Labia:        Right: No rash, tenderness or lesion.        Left: No rash, tenderness or lesion.      Vagina: Normal. No vaginal discharge, erythema or tenderness.     Cervix: Normal.     Uterus: Normal. Not enlarged and not tender.      Adnexa: Right adnexa normal and left adnexa normal.       Right: No mass or tenderness.         Left: No mass or tenderness.       Comments: IUD STRINGS IN CX OS; NO BLEEDING NEG EXT VAG EXAM Musculoskeletal:        General: Normal range of motion.      Cervical back: Normal range of motion.  Skin:    General: Skin is warm and dry.  Neurological:     General: No focal deficit present.     Mental Status: She is alert and oriented to person, place, and time.  Psychiatric:        Mood and Affect: Mood normal.        Behavior: Behavior normal.        Thought Content: Thought content normal.        Judgment: Judgment normal.    Results: Results for orders placed or performed in visit on 07/01/21 (from the past 24 hour(s))  POCT Wet Prep with KOH     Status: Normal   Collection Time: 07/01/21  3:51 PM  Result Value Ref Range   Trichomonas, UA Negative    Clue Cells Wet Prep HPF POC neg    Epithelial Wet Prep HPF POC     Yeast Wet Prep HPF POC neg    Bacteria Wet Prep HPF POC     RBC Wet Prep HPF POC     WBC Wet Prep HPF POC     KOH Prep POC Negative Negative  POCT urine pregnancy     Status: Normal   Collection Time: 07/01/21  3:51 PM  Result Value Ref Range   Preg Test, Ur Negative Negative     Assessment/Plan: Breakthrough bleeding with IUD - Plan: POCT urine pregnancy; neg UPT. Reassurance. Can take 3-6 months for cycle control. Pt also pumping about 5-6 times daily which may not be enough for amenorrhea. F/u in a 1-2 months if sx persist for GYN u/s for IUD placement.   Vaginal dryness - Plan: POCT Wet Prep with KOH; neg exam, neg wet prep. Had pain/swelling after sex that resolved. Most likely due to vag dryness from pumping/decreased estrogen levels. Try lubricants. If that doesn't help, will add vag ERT. F/u prn.   Dyspareunia in female--ext sx. Add lubricants.   Encounter for routine checking of intrauterine contraceptive device (IUD)--IDU strings in cx os. Reassurance.     Return if symptoms worsen or fail to improve.  Pollyann Roa B. Jsiah Menta, PA-C 07/01/2021 3:52 PM

## 2021-07-12 ENCOUNTER — Ambulatory Visit: Payer: Medicaid Other | Admitting: Obstetrics & Gynecology

## 2022-02-23 IMAGING — US US OB < 14 WEEKS - US OB TV
1 series · 13 of 28 positions shown · non-contrast
Comparison: None.

CLINICAL DATA: Vaginal bleeding

EXAM:
OBSTETRIC <14 WK US AND TRANSVAGINAL OB US
TECHNIQUE: Both transabdominal and transvaginal ultrasound examinations were
performed for complete evaluation of the gestation as well as the
maternal uterus, adnexal regions, and pelvic cul-de-sac.
Transvaginal technique was performed to assess early pregnancy.

[Series 1: us ob less than 14 weeks with ob transvaginal · 106 acquisitions, 13 frames shown]
[im 4/106]
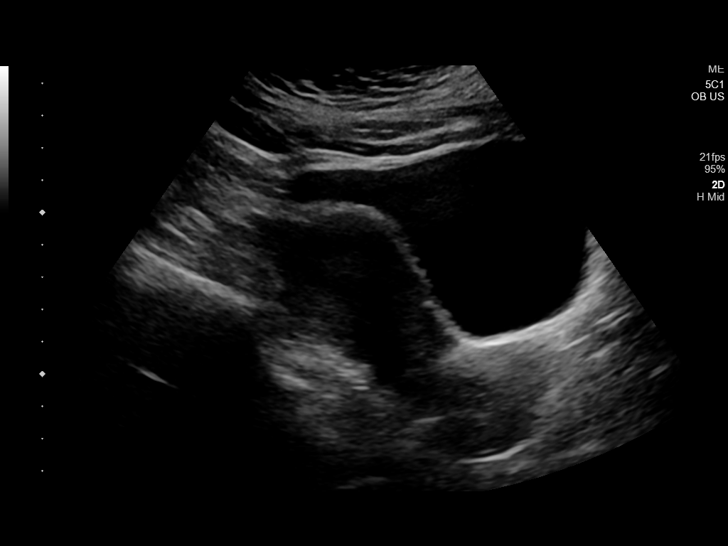
[im 12/106]
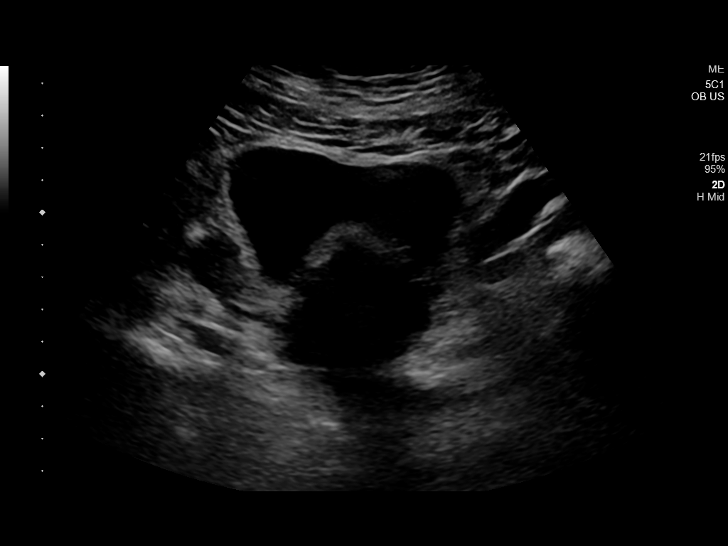
[im 20/106]
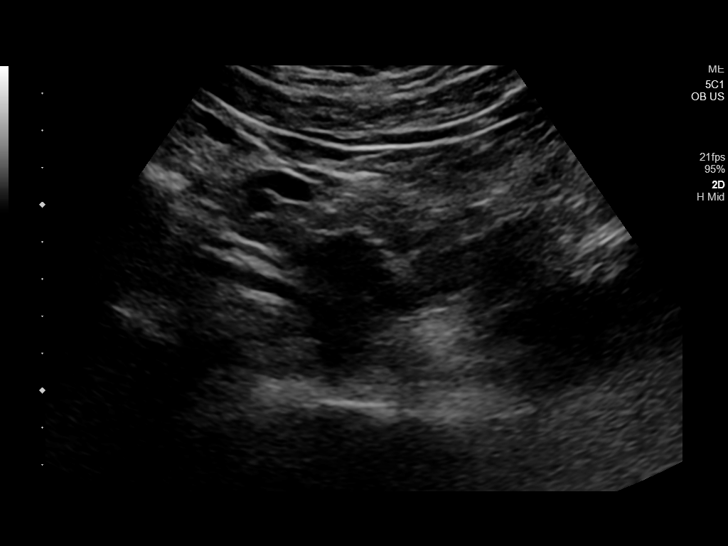
[im 28/106]
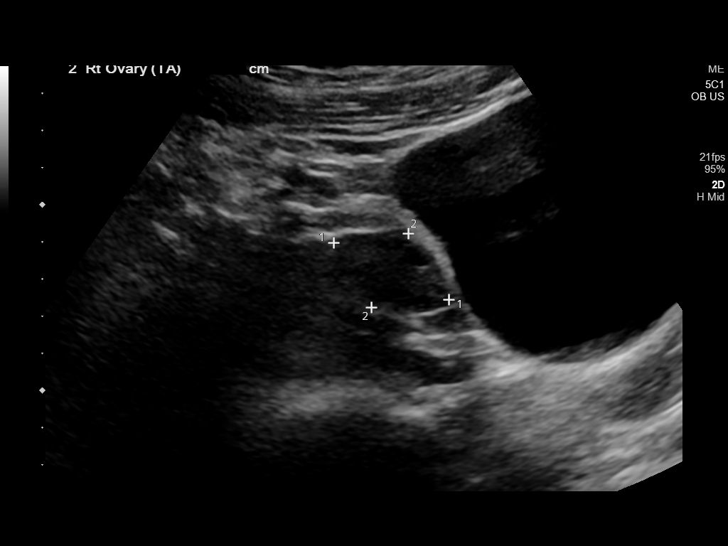
[im 36/106]
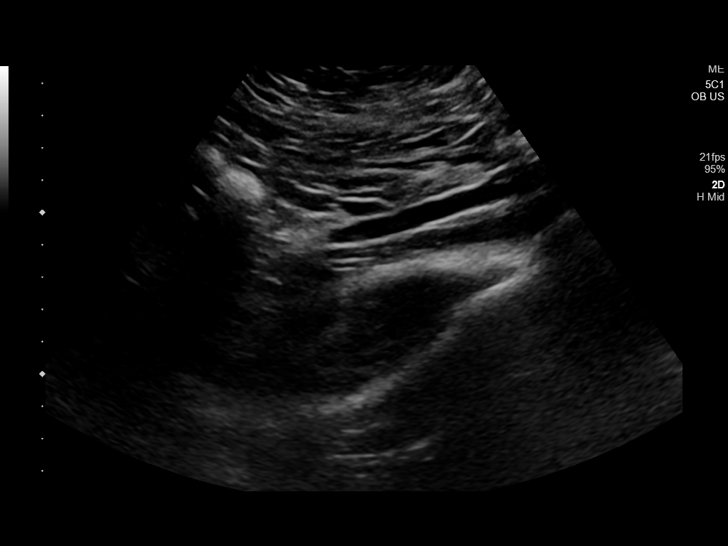
[im 43/106]
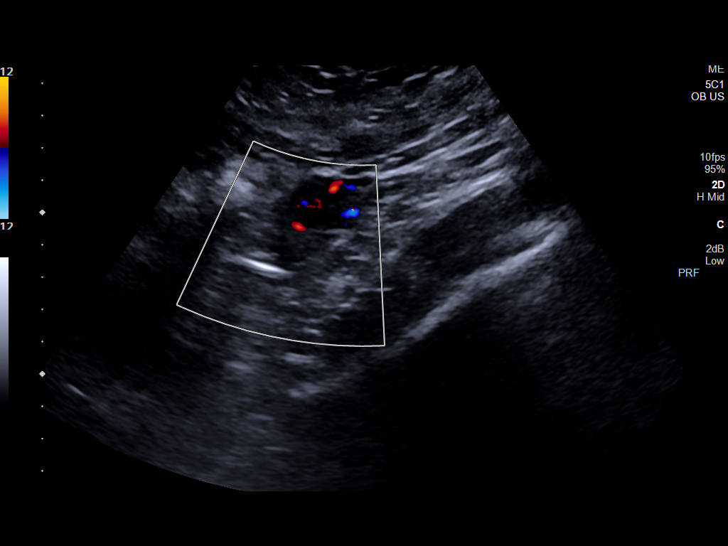
[im 55/106]
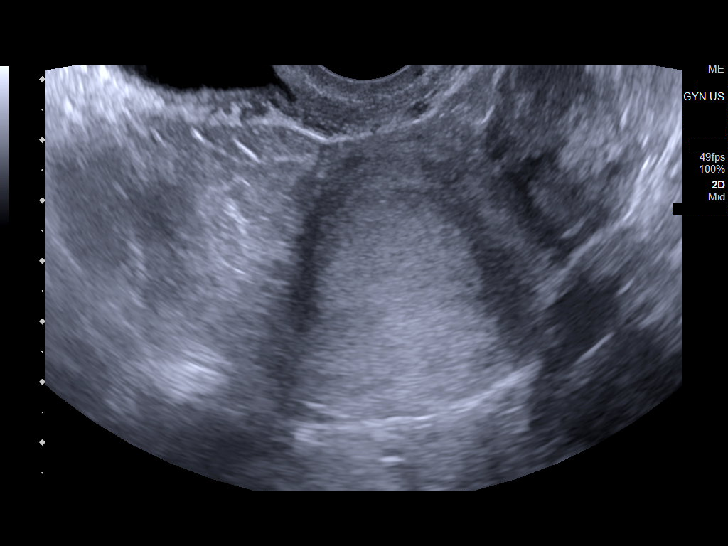
[im 63/106]
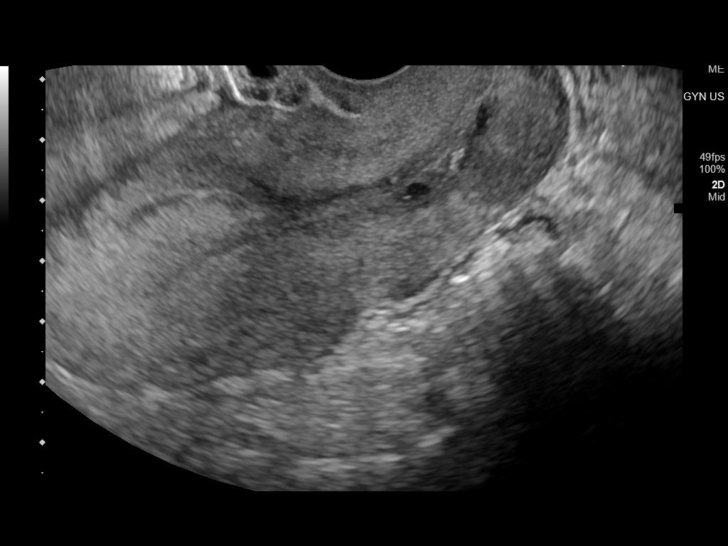
[im 71/106]
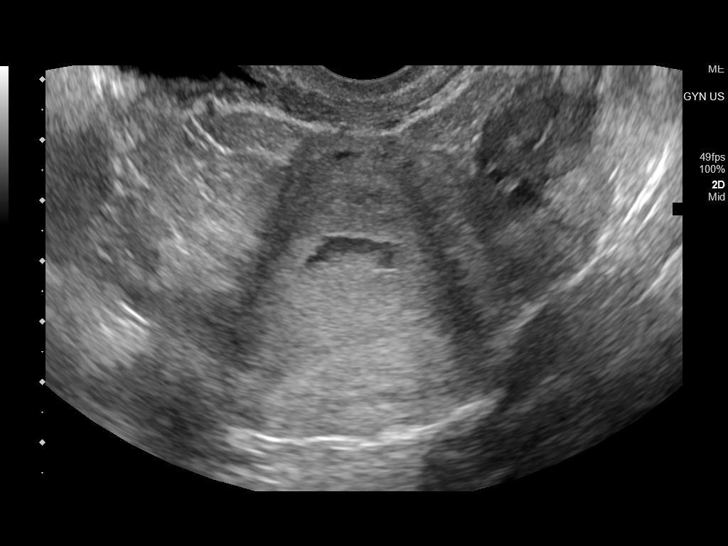
[im 78/106]
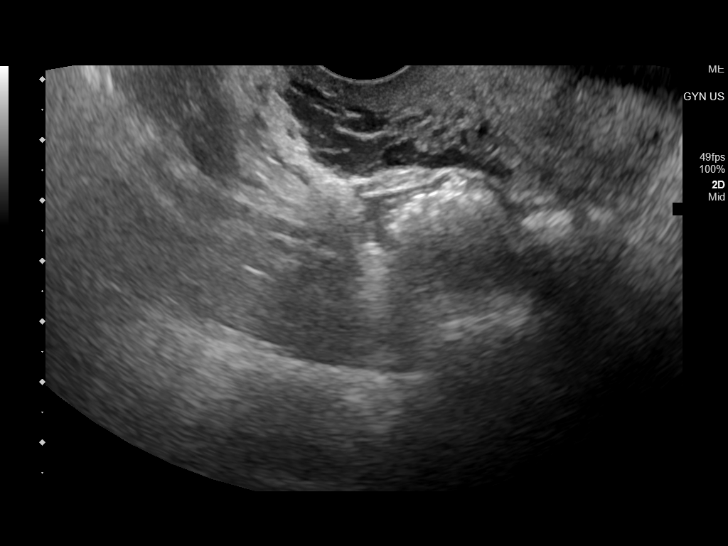
[im 86/106]
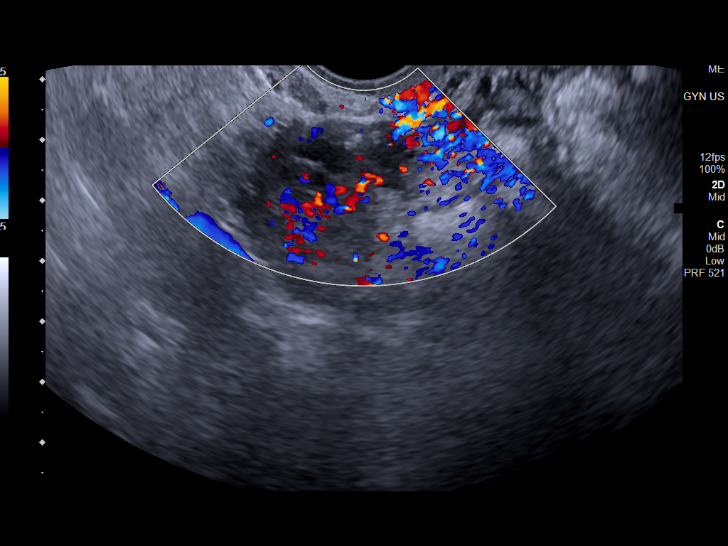
[im 94/106]
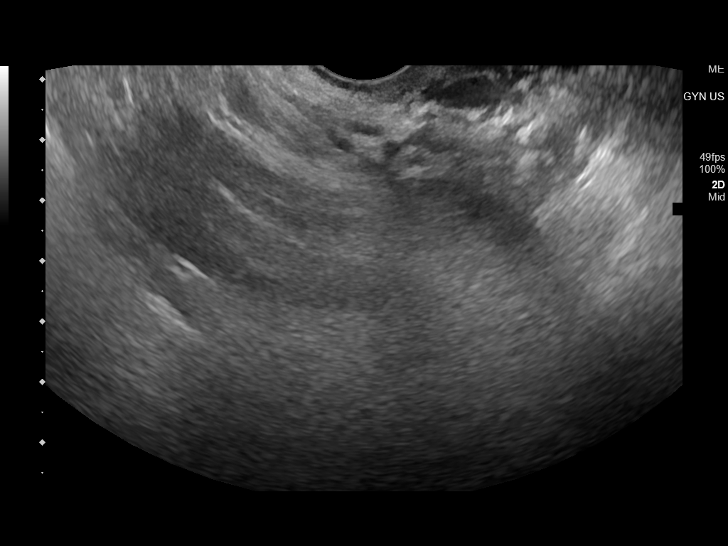
[im 102/106]
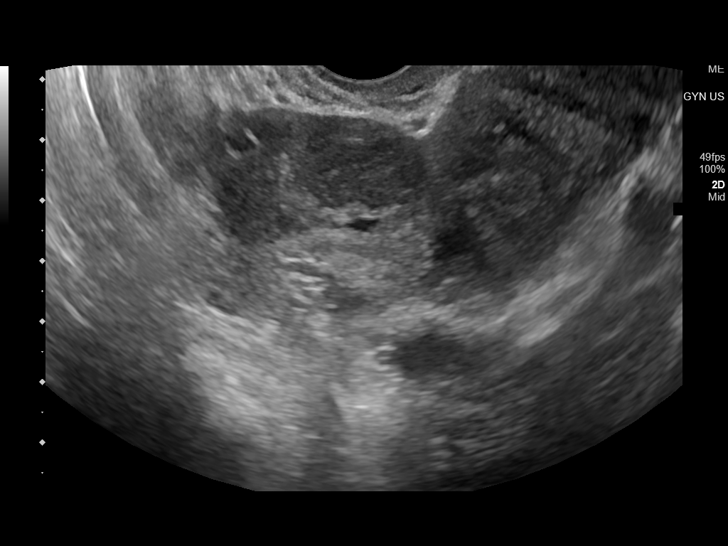

[13 of 28 positions shown; findings below may reference images not displayed]

FINDINGS: Intrauterine gestational sac: None

Yolk sac:  Not Visualized.

Embryo:  Not Visualized.

Cardiac Activity: Not Visualized.

Maternal uterus/adnexae: Within the endometrial cavity there is a
1.3 x 0.3 x 0.6 cm elongated fluid-filled structure, not
definitively a gestational sac. No evidence of double decidual sign.
The remainder of the uterus is unremarkable.

The right ovary measures 3.1 x 2.5 x 2.6 cm and the left ovary
measures 3.6 x 2.0 x 2.5 cm. There is a rounded hypervascular
isoechoic structure within the left ovary measuring approximately
2.1 x 1.6 by 2.0 cm, which could reflect a ruptured follicle.

No other adnexal masses.  No free fluid.
IMPRESSION: 1. No evidence of intrauterine pregnancy at this time. Please
correlate with serial quantitative beta HCG values. Follow-up
ultrasound may be needed to document a live intrauterine pregnancy.
2. Isoechoic hypervascular rounded structure left ovary, which may
reflect a ruptured follicle or cyst.

## 2022-03-10 IMAGING — US US OB < 14 WEEKS - US OB TV
1 series · 13 of 28 positions shown · non-contrast
Comparison: 08/20/2020 obstetric scan.

CLINICAL DATA: 24-year-old pregnant female with pregnancy of
unknown location. Quantitative beta HCG 7,207 on 08/28/2020. EDC by
LMP: 04/09/2021, projecting to an expected gestational age of 9
weeks 0 days.

EXAM:
OBSTETRIC <14 WK US AND TRANSVAGINAL OB US
TECHNIQUE: Both transabdominal and transvaginal ultrasound examinations were
performed for complete evaluation of the gestation as well as the
maternal uterus, adnexal regions, and pelvic cul-de-sac.
Transvaginal technique was performed to assess early pregnancy.

[Series 1: us ob less than 14 weeks with ob transvaginal · 13 of 97 slices shown]
[im 4/97]
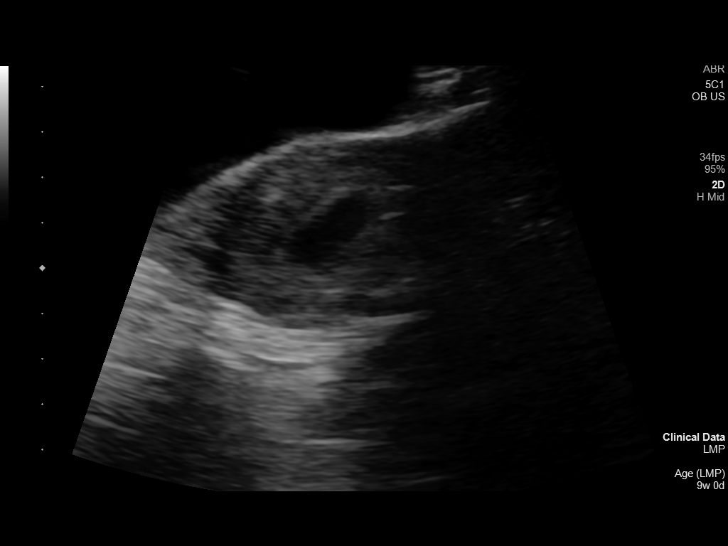
[im 11/97]
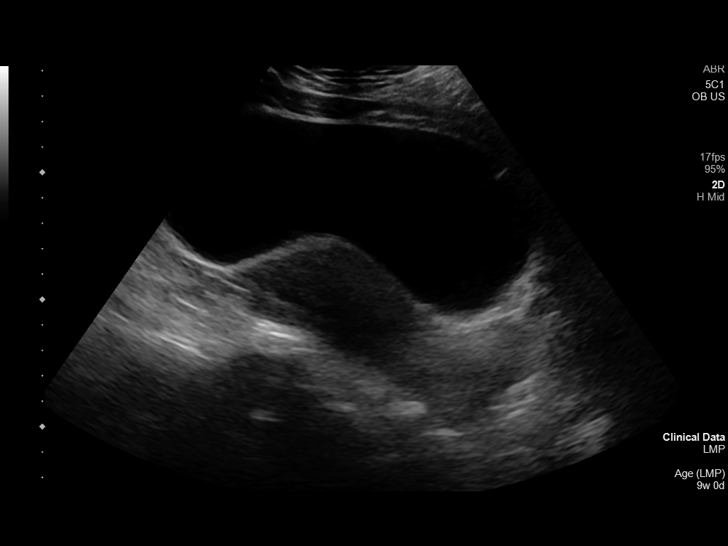
[im 18/97]
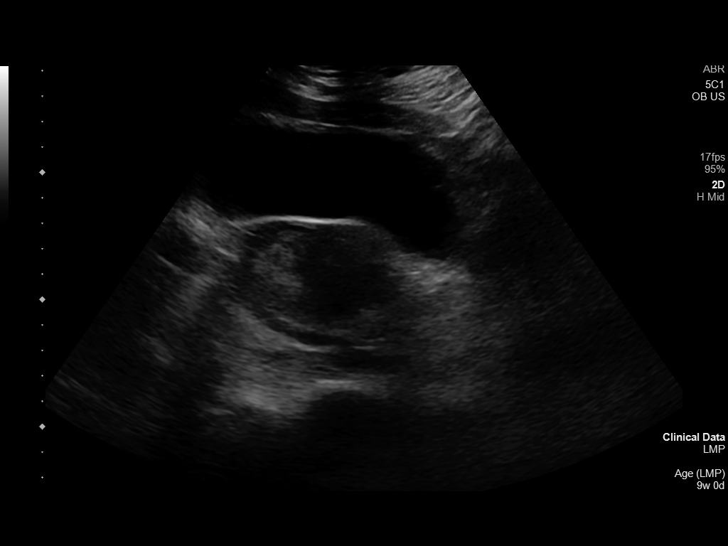
[im 25/97]
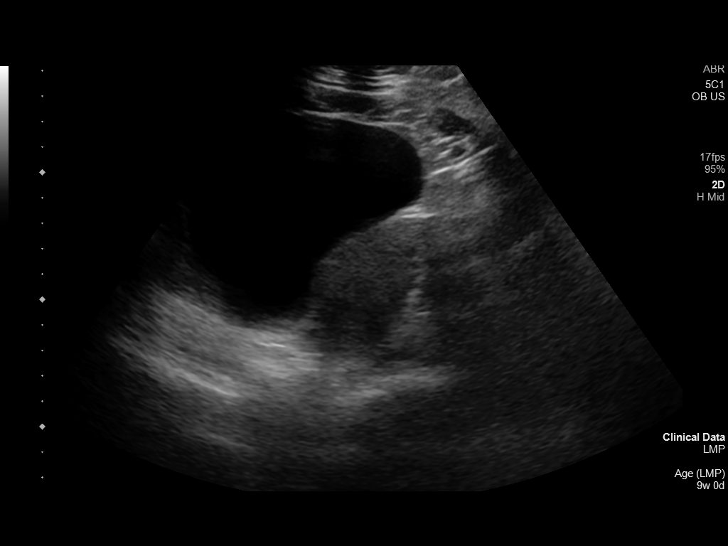
[im 33/97]
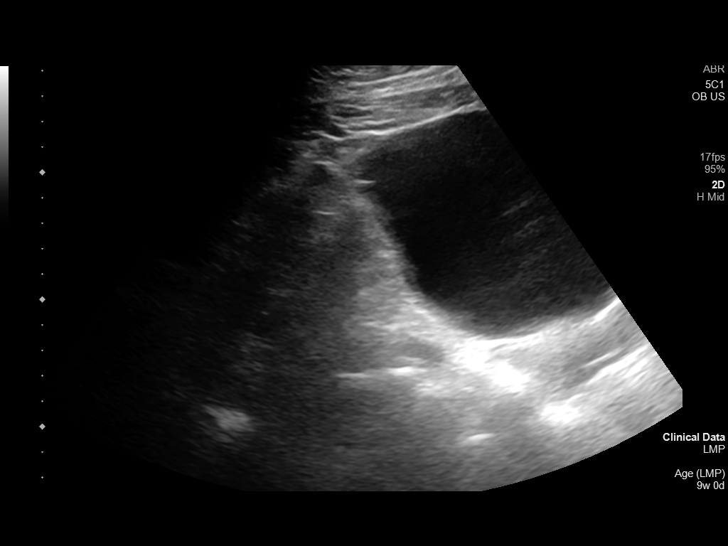
[im 40/97]
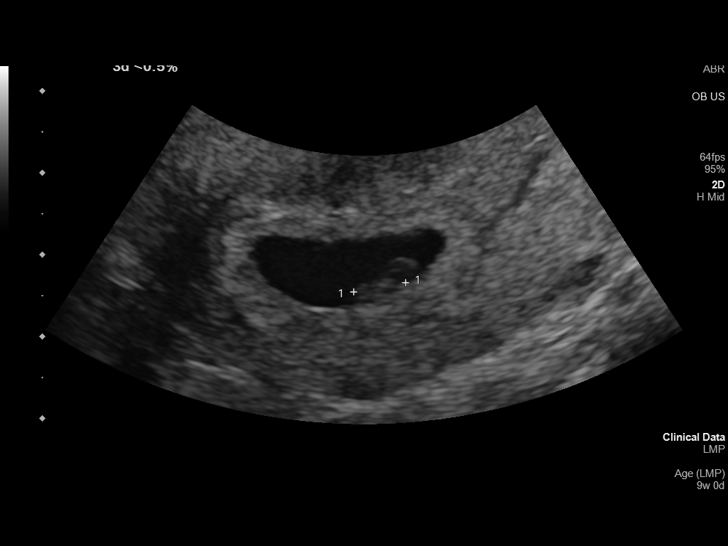
[im 50/97]
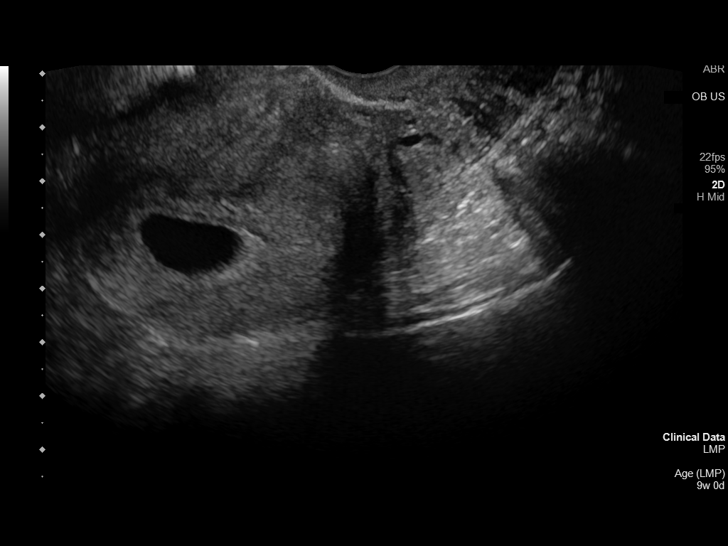
[im 57/97]
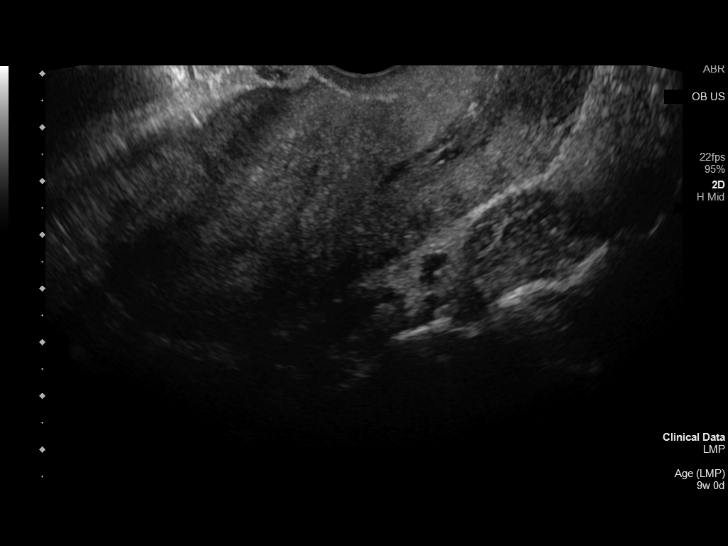
[im 65/97]
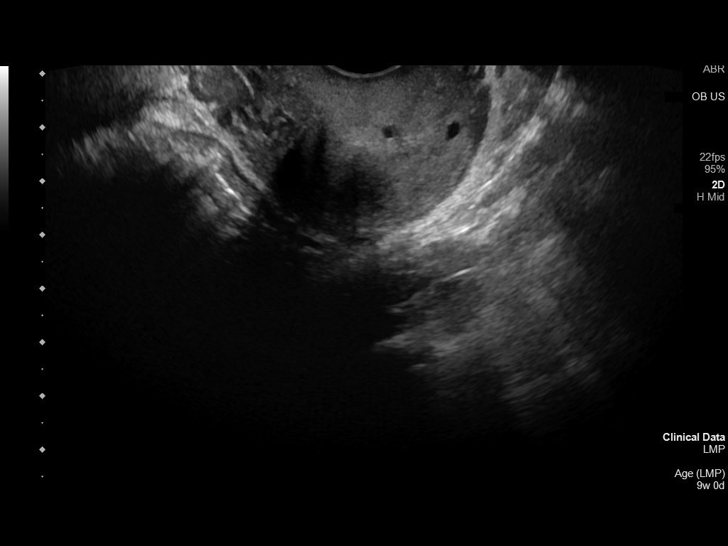
[im 72/97]
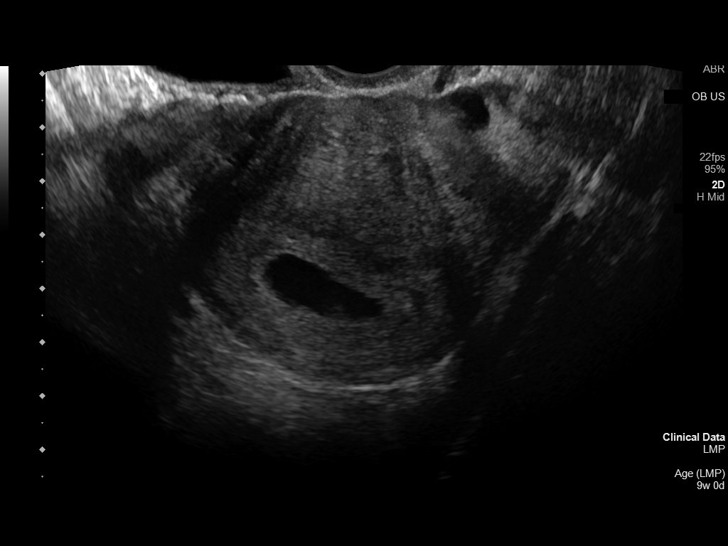
[im 79/97]
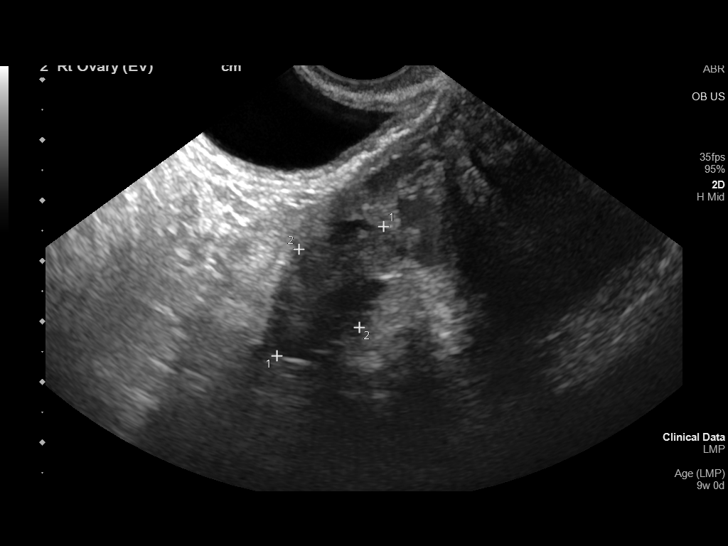
[im 86/97]
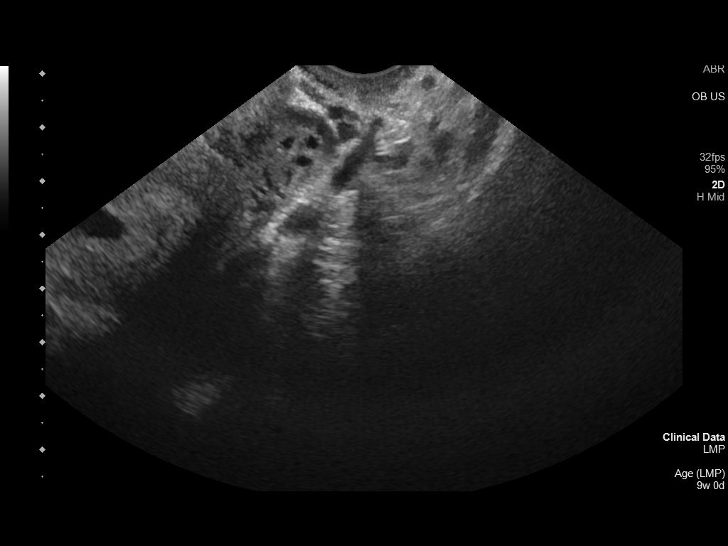
[im 93/97]
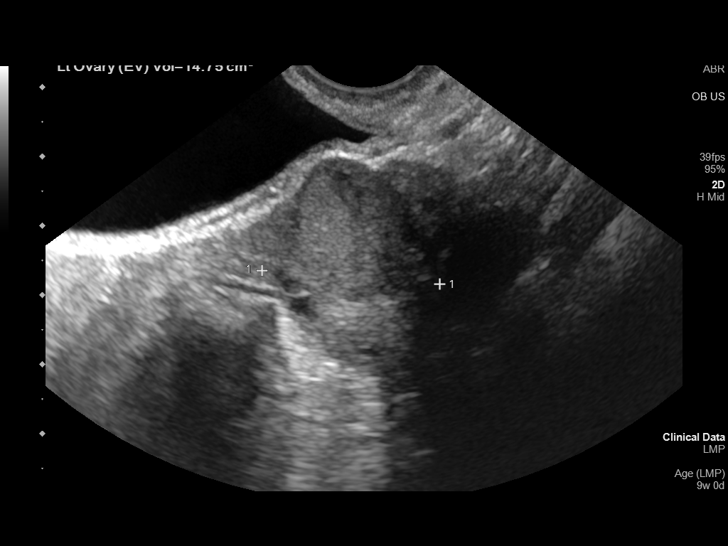

[13 of 28 positions shown; findings below may reference images not displayed]

FINDINGS: Intrauterine gestational sac: Single

Yolk sac:  Visualized.

Embryo:  Visualized.

Cardiac Activity: Visualized.

Heart Rate: 121 bpm

CRL:  6.9 mm   6 w   4 d                  US EDC: 04/26/2021

Subchorionic hemorrhage:  None visualized.

Maternal uterus/adnexae: Anteverted uterus with no uterine fibroids.
Right ovary measures 2.8 x 1.6 x 1.7 cm. Left ovary measures 3.7 x
3.0 x 2.6 cm and contains a corpus luteum. No suspicious ovarian or
adnexal masses. No abnormal free fluid in the pelvis.
IMPRESSION: Single living intrauterine gestation at 6 weeks 4 days by crown-rump
length, discordant with the expected gestational age of 9 weeks 0
days by provided menstrual dating. Suggest follow-up obstetric scan
in 4 weeks to assess for appropriate fetal growth.

## 2022-06-13 IMAGING — US US OB COMP +14 WK
1 series · 13 of 28 positions shown · non-contrast
Comparison: none

CLINICAL DATA: Second trimester pregnancy for fetal anatomy survey.

EXAM:
OBSTETRICAL ULTRASOUND >14 WKS

[Series 1: us ob comp + 14 wk · 13 of 88 slices shown]
[im 4/88]
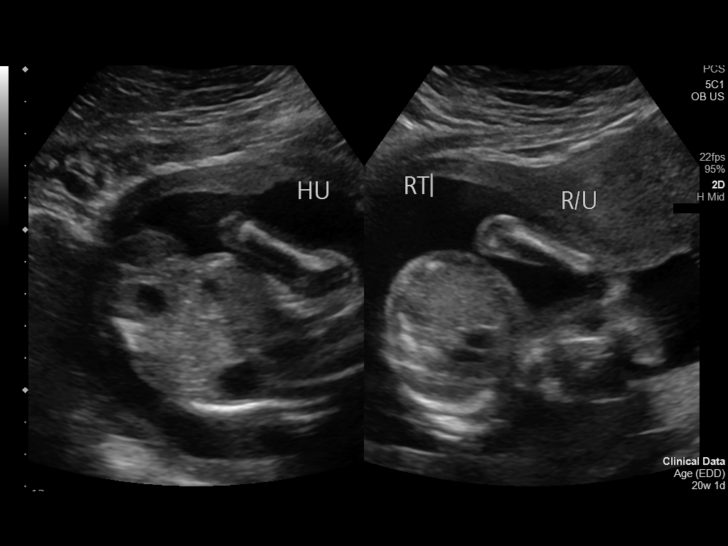
[im 10/88]
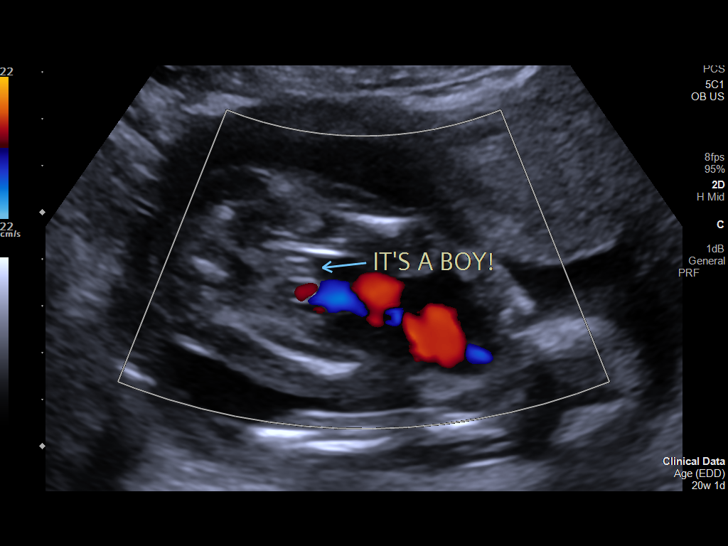
[im 17/88]
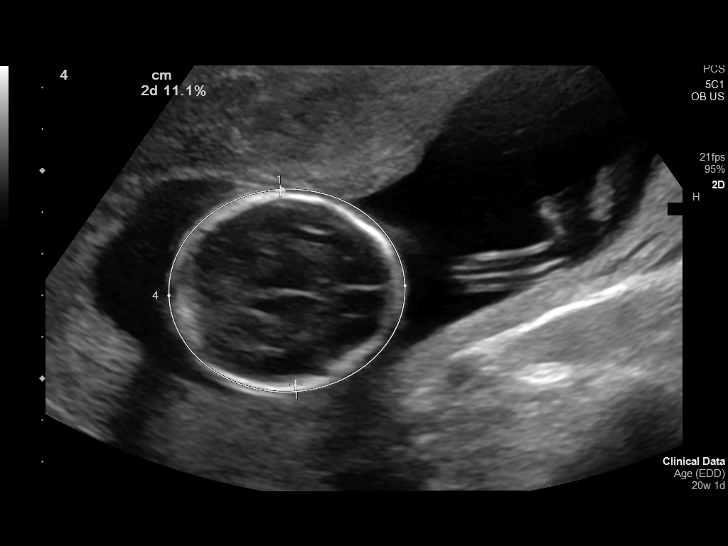
[im 23/88]
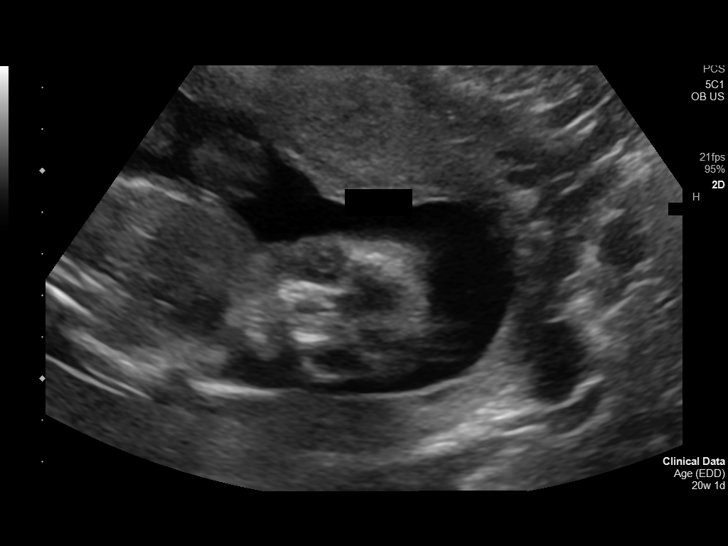
[im 30/88]
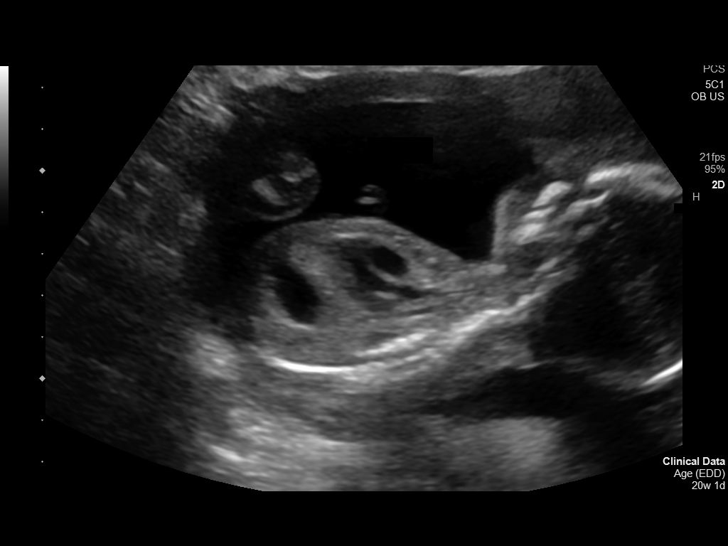
[im 36/88]
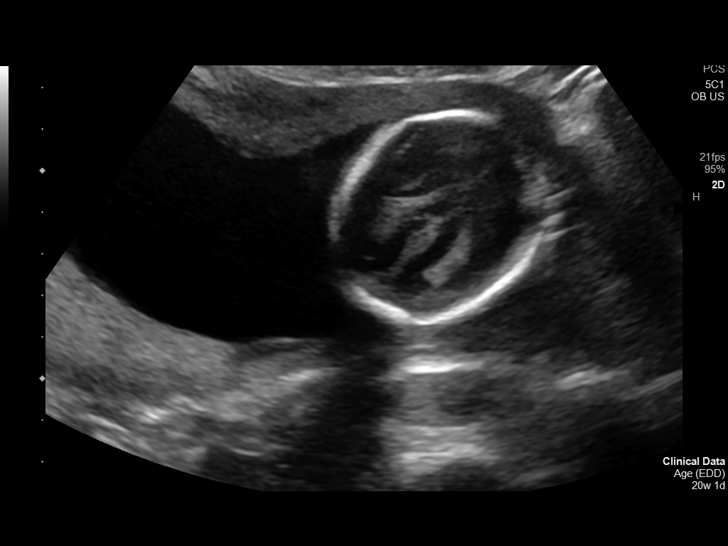
[im 46/88]
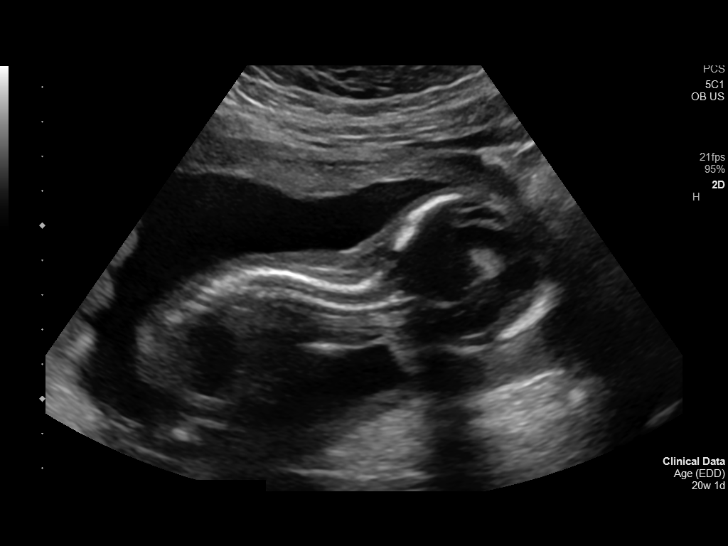
[im 52/88]
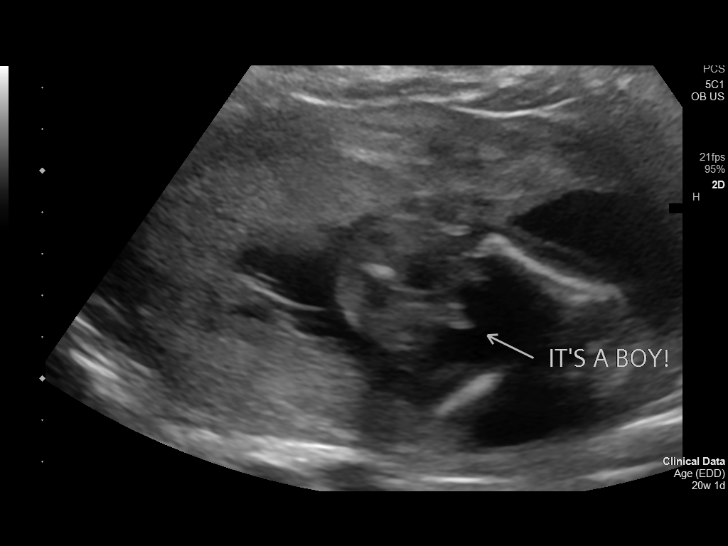
[im 59/88]
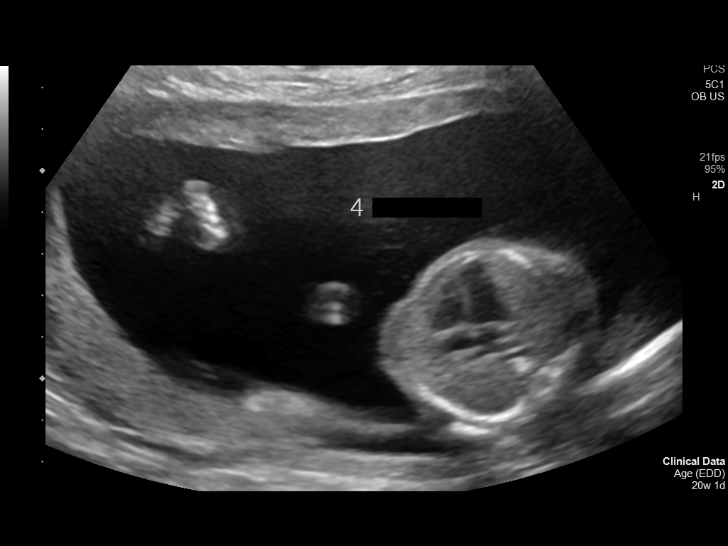
[im 65/88]
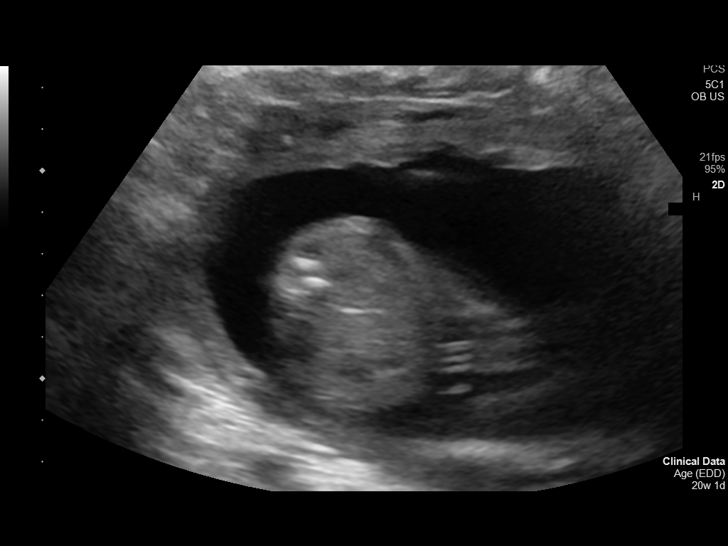
[im 71/88]
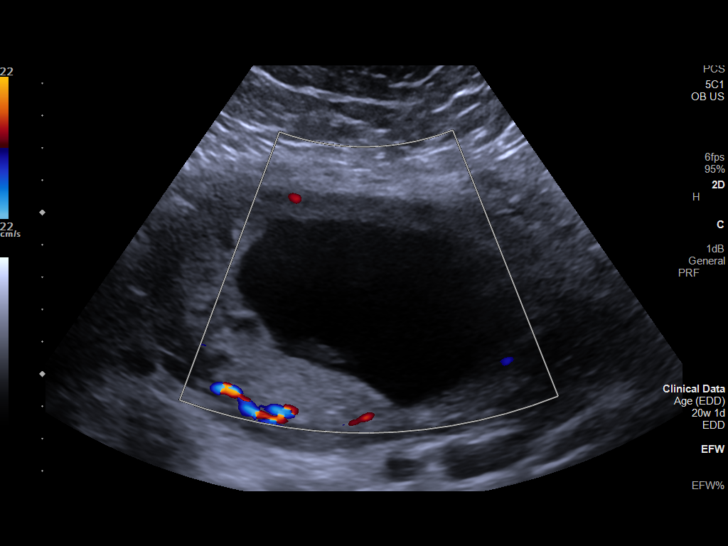
[im 78/88]
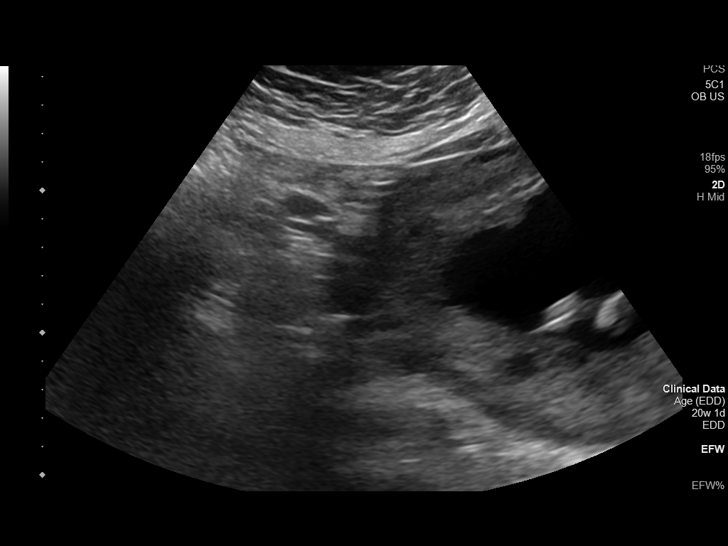
[im 84/88]
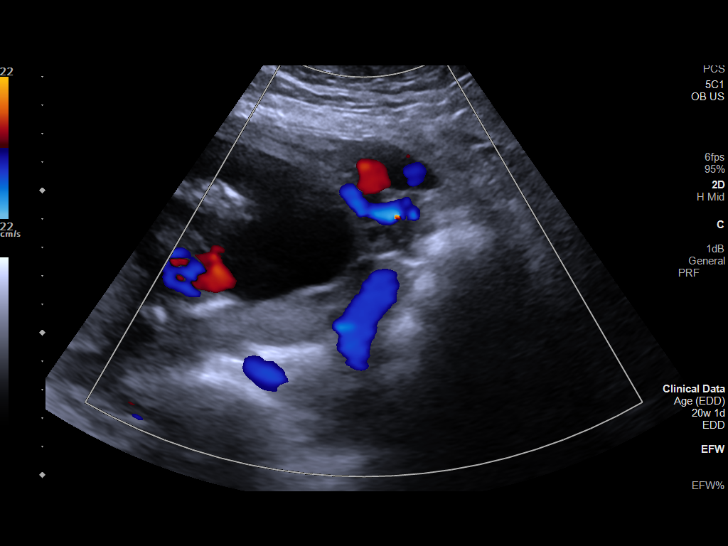

[13 of 28 positions shown; findings below may reference images not displayed]

FINDINGS: Number of Fetuses: 1

Heart Rate:  145 bpm

Movement: Yes

Presentation: Cephalic

Previa: No

Placental Location: Fundal and posterior

Amniotic Fluid (Subjective): within normal limits

Amniotic Fluid (Objective):

Vertical pocket = 5.5cm

FETAL BIOMETRY

BPD: 4.7cm 20w 2d

HC:   16.6cm 19w 2d

AC:   15.7cm 20w 6d

FL:   3.2cm 19w 6d

Current Mean GA: 20w 1d US EDC: 04/26/2021

Assigned GA:  20w 1d Assigned EDC: 04/26/2021

FETAL ANATOMY

Lateral Ventricles: Appears normal

Thalami/CSP: Appears normal

Posterior Fossa:  Appears normal

Nuchal Region: Appears normal   NFT= 3.7 mm

Upper Lip: Appears normal

Spine: Appears normal

4 Chamber Heart on Left: Appears normal

LVOT: Appears normal

RVOT: Appears normal

Stomach on Left: Appears normal

3 Vessel Cord: Appears normal

Cord Insertion site: Appears normal

Kidneys: Appears normal

Bladder: Appears normal

Extremities: Appears normal

Sex: Male

Technically difficult due to: Maternal habitus

Maternal Findings:

Cervix:  4.2 cm TA
IMPRESSION: Assigned GA currently 20 weeks 1 day.  Appropriate fetal growth.

Unremarkable fetal anatomic survey.  No fetal anomalies identified.

## 2023-05-04 ENCOUNTER — Ambulatory Visit: Payer: Self-pay | Admitting: *Deleted

## 2023-05-04 NOTE — Telephone Encounter (Signed)
  Chief Complaint: chest pain-R Symptoms: mild pian- comes and goes- can last over 1 hour- no other symptoms Frequency: 2 days Pertinent Negatives: Patient denies dizziness, nausea, vomiting, sweating, fever, difficulty breathing, cough Disposition: [] ED /[x] Urgent Care (no appt availability in office) / [] Appointment(In office/virtual)/ []  [facility]/ [] Home Care/ [] Refused Recommended Disposition /[]  Mobile Bus/ []  Follow-up with PCP Additional Notes: Patient states she does not have insurance/PCP- care options given- unsure if she will be seen.

## 2023-05-04 NOTE — Telephone Encounter (Signed)
Reason for Disposition  [1] Chest pain lasts > 5 minutes AND [2] occurred > 3 days ago (72 hours) AND [3] NO chest pain or cardiac symptoms now  Answer Assessment - Initial Assessment Questions 1. LOCATION: "Where does it hurt?"       R side 2. RADIATION: "Does the pain go anywhere else?" (e.g., into neck, jaw, arms, back)     R side only 3. ONSET: "When did the chest pain begin?" (Minutes, hours or days)      2 days ago 4. PATTERN: "Does the pain come and go, or has it been constant since it started?"  "Does it get worse with exertion?"      Comes and goes 5. DURATION: "How long does it last" (e.g., seconds, minutes, hours)     hours 6. SEVERITY: "How bad is the pain?"  (e.g., Scale 1-10; mild, moderate, or severe)    - MILD (1-3): doesn't interfere with normal activities     - MODERATE (4-7): interferes with normal activities or awakens from sleep    - SEVERE (8-10): excruciating pain, unable to do any normal activities       mild 7. CARDIAC RISK FACTORS: "Do you have any history of heart problems or risk factors for heart disease?" (e.g., angina, prior heart attack; diabetes, high blood pressure, high cholesterol, smoker, or strong family history of heart disease)     no 8. PULMONARY RISK FACTORS: "Do you have any history of lung disease?"  (e.g., blood clots in lung, asthma, emphysema, birth control pills)     no 9. CAUSE: "What do you think is causing the chest pain?"     no 10. OTHER SYMPTOMS: "Do you have any other symptoms?" (e.g., dizziness, nausea, vomiting, sweating, fever, difficulty breathing, cough)       no 11. PREGNANCY: "Is there any chance you are pregnant?" "When was your last menstrual period?"       No- IUD  Protocols used: Chest Pain-A-AH
